# Patient Record
Sex: Female | Born: 1985 | Race: Black or African American | Hispanic: No | Marital: Married | Smoking: Never smoker
Health system: Southern US, Community
[De-identification: ages and names within clinical notes are randomized; demographics above are authoritative.]

## PROBLEM LIST (undated history)

## (undated) DIAGNOSIS — N6320 Unspecified lump in the left breast, unspecified quadrant: Secondary | ICD-10-CM

## (undated) DIAGNOSIS — D6859 Other primary thrombophilia: Secondary | ICD-10-CM

## (undated) HISTORY — PX: BREAST LUMPECTOMY: SHX2

---

## 2006-11-22 ENCOUNTER — Encounter: Admission: RE | Admit: 2006-11-22 | Discharge: 2006-11-22 | Payer: Self-pay | Admitting: Obstetrics and Gynecology

## 2007-04-24 ENCOUNTER — Ambulatory Visit: Payer: Self-pay | Admitting: Hematology and Oncology

## 2007-05-06 LAB — BASIC METABOLIC PANEL
BUN: 9 mg/dL (ref 6–23)
CO2: 23 mEq/L (ref 19–32)
Calcium: 9.2 mg/dL (ref 8.4–10.5)
Chloride: 103 mEq/L (ref 96–112)
Creatinine, Ser: 0.59 mg/dL (ref 0.40–1.20)
Glucose, Bld: 89 mg/dL (ref 70–99)
Potassium: 4 mEq/L (ref 3.5–5.3)
Sodium: 136 mEq/L (ref 135–145)

## 2007-05-06 LAB — CBC WITH DIFFERENTIAL/PLATELET
BASO%: 0.6 % (ref 0.0–2.0)
Basophils Absolute: 0.1 10*3/uL (ref 0.0–0.1)
EOS%: 2.7 % (ref 0.0–7.0)
Eosinophils Absolute: 0.2 10*3/uL (ref 0.0–0.5)
HCT: 34.9 % (ref 34.8–46.6)
HGB: 12 g/dL (ref 11.6–15.9)
LYMPH%: 11.3 % — ABNORMAL LOW (ref 14.0–48.0)
MCH: 29 pg (ref 26.0–34.0)
MCHC: 34.3 g/dL (ref 32.0–36.0)
MCV: 84.5 fL (ref 81.0–101.0)
MONO#: 0.5 10*3/uL (ref 0.1–0.9)
MONO%: 5.6 % (ref 0.0–13.0)
NEUT#: 7 10*3/uL — ABNORMAL HIGH (ref 1.5–6.5)
NEUT%: 79.8 % — ABNORMAL HIGH (ref 39.6–76.8)
Platelets: 253 10*3/uL (ref 145–400)
RBC: 4.14 10*6/uL (ref 3.70–5.32)
RDW: 14.6 % — ABNORMAL HIGH (ref 11.3–14.5)
WBC: 8.8 10*3/uL (ref 3.9–10.0)
lymph#: 1 10*3/uL (ref 0.9–3.3)

## 2007-05-16 LAB — CBC WITH DIFFERENTIAL/PLATELET
BASO%: 0.3 % (ref 0.0–2.0)
Basophils Absolute: 0 10*3/uL (ref 0.0–0.1)
EOS%: 2.9 % (ref 0.0–7.0)
Eosinophils Absolute: 0.3 10*3/uL (ref 0.0–0.5)
HCT: 34.5 % — ABNORMAL LOW (ref 34.8–46.6)
HGB: 11.7 g/dL (ref 11.6–15.9)
LYMPH%: 13.3 % — ABNORMAL LOW (ref 14.0–48.0)
MCH: 28.6 pg (ref 26.0–34.0)
MCHC: 34 g/dL (ref 32.0–36.0)
MCV: 84.1 fL (ref 81.0–101.0)
MONO#: 0.6 10*3/uL (ref 0.1–0.9)
MONO%: 5.4 % (ref 0.0–13.0)
NEUT#: 8.2 10*3/uL — ABNORMAL HIGH (ref 1.5–6.5)
NEUT%: 78.1 % — ABNORMAL HIGH (ref 39.6–76.8)
Platelets: 242 10*3/uL (ref 145–400)
RBC: 4.09 10*6/uL (ref 3.70–5.32)
RDW: 14.7 % — ABNORMAL HIGH (ref 11.3–14.5)
WBC: 10.5 10*3/uL — ABNORMAL HIGH (ref 3.9–10.0)
lymph#: 1.4 10*3/uL (ref 0.9–3.3)

## 2007-08-12 ENCOUNTER — Ambulatory Visit: Payer: Self-pay | Admitting: Hematology and Oncology

## 2007-08-14 LAB — CBC WITH DIFFERENTIAL/PLATELET
BASO%: 0.1 % (ref 0.0–2.0)
Basophils Absolute: 0 10*3/uL (ref 0.0–0.1)
EOS%: 0.5 % (ref 0.0–7.0)
Eosinophils Absolute: 0.1 10*3/uL (ref 0.0–0.5)
HCT: 30 % — ABNORMAL LOW (ref 34.8–46.6)
HGB: 10.3 g/dL — ABNORMAL LOW (ref 11.6–15.9)
LYMPH%: 11.4 % — ABNORMAL LOW (ref 14.0–48.0)
MCH: 28.1 pg (ref 26.0–34.0)
MCHC: 34.3 g/dL (ref 32.0–36.0)
MCV: 81.9 fL (ref 81.0–101.0)
MONO#: 0.7 10*3/uL (ref 0.1–0.9)
MONO%: 4.8 % (ref 0.0–13.0)
NEUT#: 11.6 10*3/uL — ABNORMAL HIGH (ref 1.5–6.5)
NEUT%: 83.2 % — ABNORMAL HIGH (ref 39.6–76.8)
Platelets: 214 10*3/uL (ref 145–400)
RBC: 3.66 10*6/uL — ABNORMAL LOW (ref 3.70–5.32)
RDW: 13.9 % (ref 11.3–14.5)
WBC: 14 10*3/uL — ABNORMAL HIGH (ref 3.9–10.0)
lymph#: 1.6 10*3/uL (ref 0.9–3.3)

## 2007-10-10 ENCOUNTER — Ambulatory Visit: Payer: Self-pay | Admitting: Hematology and Oncology

## 2007-10-23 LAB — CBC WITH DIFFERENTIAL/PLATELET
BASO%: 0.3 % (ref 0.0–2.0)
Basophils Absolute: 0 10*3/uL (ref 0.0–0.1)
EOS%: 0.3 % (ref 0.0–7.0)
Eosinophils Absolute: 0 10*3/uL (ref 0.0–0.5)
HCT: 31.1 % — ABNORMAL LOW (ref 34.8–46.6)
HGB: 10.2 g/dL — ABNORMAL LOW (ref 11.6–15.9)
LYMPH%: 13.4 % — ABNORMAL LOW (ref 14.0–48.0)
MCH: 25.1 pg — ABNORMAL LOW (ref 26.0–34.0)
MCHC: 32.7 g/dL (ref 32.0–36.0)
MCV: 76.9 fL — ABNORMAL LOW (ref 81.0–101.0)
MONO#: 0.7 10*3/uL (ref 0.1–0.9)
MONO%: 5.6 % (ref 0.0–13.0)
NEUT#: 9.8 10*3/uL — ABNORMAL HIGH (ref 1.5–6.5)
NEUT%: 80.4 % — ABNORMAL HIGH (ref 39.6–76.8)
Platelets: 233 10*3/uL (ref 145–400)
RBC: 4.05 10*6/uL (ref 3.70–5.32)
RDW: 14.2 % (ref 11.3–14.5)
WBC: 12.2 10*3/uL — ABNORMAL HIGH (ref 3.9–10.0)
lymph#: 1.6 10*3/uL (ref 0.9–3.3)

## 2007-11-08 ENCOUNTER — Inpatient Hospital Stay (HOSPITAL_COMMUNITY): Admission: RE | Admit: 2007-11-08 | Discharge: 2007-11-11 | Payer: Self-pay | Admitting: Obstetrics and Gynecology

## 2009-03-09 ENCOUNTER — Encounter (INDEPENDENT_AMBULATORY_CARE_PROVIDER_SITE_OTHER): Payer: Self-pay | Admitting: Obstetrics and Gynecology

## 2009-03-09 ENCOUNTER — Ambulatory Visit (HOSPITAL_COMMUNITY): Admission: RE | Admit: 2009-03-09 | Discharge: 2009-03-09 | Payer: Self-pay | Admitting: Obstetrics and Gynecology

## 2009-03-09 HISTORY — PX: DILATION AND EVACUATION: SHX1459

## 2009-12-21 ENCOUNTER — Encounter: Admission: RE | Admit: 2009-12-21 | Discharge: 2009-12-21 | Payer: Self-pay | Admitting: Gastroenterology

## 2010-06-26 ENCOUNTER — Encounter: Payer: Self-pay | Admitting: Obstetrics and Gynecology

## 2010-08-23 ENCOUNTER — Other Ambulatory Visit: Payer: Self-pay | Admitting: Hematology and Oncology

## 2010-08-23 ENCOUNTER — Encounter (HOSPITAL_BASED_OUTPATIENT_CLINIC_OR_DEPARTMENT_OTHER): Payer: 59 | Admitting: Hematology and Oncology

## 2010-08-23 DIAGNOSIS — O99891 Other specified diseases and conditions complicating pregnancy: Secondary | ICD-10-CM

## 2010-08-23 DIAGNOSIS — D6859 Other primary thrombophilia: Secondary | ICD-10-CM

## 2010-08-23 LAB — CBC WITH DIFFERENTIAL/PLATELET
BASO%: 0.3 % (ref 0.0–2.0)
Basophils Absolute: 0 10*3/uL (ref 0.0–0.1)
EOS%: 0.3 % (ref 0.0–7.0)
Eosinophils Absolute: 0 10*3/uL (ref 0.0–0.5)
HCT: 35.7 % (ref 34.8–46.6)
HGB: 12 g/dL (ref 11.6–15.9)
LYMPH%: 15.4 % (ref 14.0–49.7)
MCH: 28.5 pg (ref 25.1–34.0)
MCHC: 33.6 g/dL (ref 31.5–36.0)
MCV: 84.6 fL (ref 79.5–101.0)
MONO#: 0.5 10*3/uL (ref 0.1–0.9)
MONO%: 4.5 % (ref 0.0–14.0)
NEUT#: 8.9 10*3/uL — ABNORMAL HIGH (ref 1.5–6.5)
NEUT%: 79.5 % — ABNORMAL HIGH (ref 38.4–76.8)
Platelets: 237 10*3/uL (ref 145–400)
RBC: 4.21 10*6/uL (ref 3.70–5.45)
RDW: 13.8 % (ref 11.2–14.5)
WBC: 11.2 10*3/uL — ABNORMAL HIGH (ref 3.9–10.3)
lymph#: 1.7 10*3/uL (ref 0.9–3.3)

## 2010-08-23 LAB — COMPREHENSIVE METABOLIC PANEL
ALT: <8 U/L (ref 0–35)
AST: 12 U/L (ref 0–37)
Albumin: 4.2 g/dL (ref 3.5–5.2)
Alkaline Phosphatase: 36 U/L — ABNORMAL LOW (ref 39–117)
BUN: 11 mg/dL (ref 6–23)
CO2: 23 mEq/L (ref 19–32)
Calcium: 9.1 mg/dL (ref 8.4–10.5)
Chloride: 103 mEq/L (ref 96–112)
Creatinine, Ser: 0.61 mg/dL (ref 0.40–1.20)
Glucose, Bld: 76 mg/dL (ref 70–99)
Potassium: 4 mEq/L (ref 3.5–5.3)
Sodium: 137 mEq/L (ref 135–145)
Total Bilirubin: 0.4 mg/dL (ref 0.3–1.2)
Total Protein: 6.8 g/dL (ref 6.0–8.3)

## 2010-08-23 LAB — LACTATE DEHYDROGENASE: LDH: 85 U/L — ABNORMAL LOW (ref 94–250)

## 2010-08-29 LAB — HEPATITIS B SURFACE ANTIGEN: Hepatitis B Surface Ag: NEGATIVE

## 2010-08-29 LAB — HIV ANTIBODY (ROUTINE TESTING W REFLEX): HIV: NONREACTIVE

## 2010-08-29 LAB — RUBELLA ANTIBODY, IGM: Rubella: UNDETERMINED

## 2010-08-29 LAB — ABO/RH: RH Type: NEGATIVE

## 2010-08-29 LAB — RPR: RPR: NONREACTIVE

## 2010-09-07 LAB — CBC
HCT: 39.4 % (ref 36.0–46.0)
Hemoglobin: 12.9 g/dL (ref 12.0–15.0)
MCHC: 32.7 g/dL (ref 30.0–36.0)
MCV: 86.8 fL (ref 78.0–100.0)
Platelets: 242 10*3/uL (ref 150–400)
RBC: 4.54 MIL/uL (ref 3.87–5.11)
RDW: 15.1 % (ref 11.5–15.5)
WBC: 11.6 10*3/uL — ABNORMAL HIGH (ref 4.0–10.5)

## 2010-09-07 LAB — RH IMMUNE GLOBULIN WORKUP (NOT WOMEN'S HOSP)
ABO/RH(D): A NEG
Antibody Screen: NEGATIVE

## 2010-09-27 ENCOUNTER — Other Ambulatory Visit: Payer: Self-pay | Admitting: Hematology and Oncology

## 2010-09-27 ENCOUNTER — Encounter (HOSPITAL_BASED_OUTPATIENT_CLINIC_OR_DEPARTMENT_OTHER): Payer: 59 | Admitting: Hematology and Oncology

## 2010-09-27 DIAGNOSIS — O99891 Other specified diseases and conditions complicating pregnancy: Secondary | ICD-10-CM

## 2010-09-27 DIAGNOSIS — D6859 Other primary thrombophilia: Secondary | ICD-10-CM

## 2010-09-27 DIAGNOSIS — Z7901 Long term (current) use of anticoagulants: Secondary | ICD-10-CM

## 2010-09-27 LAB — CBC WITH DIFFERENTIAL/PLATELET
BASO%: 0.3 % (ref 0.0–2.0)
Basophils Absolute: 0 10*3/uL (ref 0.0–0.1)
EOS%: 0.5 % (ref 0.0–7.0)
Eosinophils Absolute: 0.1 10*3/uL (ref 0.0–0.5)
HCT: 35.6 % (ref 34.8–46.6)
HGB: 12 g/dL (ref 11.6–15.9)
LYMPH%: 13.5 % — ABNORMAL LOW (ref 14.0–49.7)
MCH: 28.9 pg (ref 25.1–34.0)
MCHC: 33.7 g/dL (ref 31.5–36.0)
MCV: 85.9 fL (ref 79.5–101.0)
MONO#: 0.5 10*3/uL (ref 0.1–0.9)
MONO%: 4.5 % (ref 0.0–14.0)
NEUT#: 8.8 10*3/uL — ABNORMAL HIGH (ref 1.5–6.5)
NEUT%: 81.2 % — ABNORMAL HIGH (ref 38.4–76.8)
Platelets: 207 10*3/uL (ref 145–400)
RBC: 4.15 10*6/uL (ref 3.70–5.45)
RDW: 15.1 % — ABNORMAL HIGH (ref 11.2–14.5)
WBC: 10.9 10*3/uL — ABNORMAL HIGH (ref 3.9–10.3)
lymph#: 1.5 10*3/uL (ref 0.9–3.3)

## 2010-10-17 NOTE — Op Note (Signed)
Lynn Cox, SPILLMAN              ACCOUNT NO.:  [id number]   MEDICAL RECORD NO.:  [id number]          PATIENT TYPE:  INP   LOCATION:  9117                          FACILITY:  [facility]   PHYSICIAN:  Maxie Better, M.D.DATE OF BIRTH:  November 30, 1985   DATE OF PROCEDURE:  11/08/2007  DATE OF DISCHARGE:                               OPERATIVE REPORT   PREOPERATIVE DIAGNOSES:  1. Previous cesarean section.  2. Protein S deficiency.  3. Term gestation.   PROCEDURES:  1. Repeat cesarean section.  2. Sharl Ma hysterotomy.   POSTOPERATIVE DIAGNOSES:  1. Protein S deficiency.  2. Previous cesarean section.  3. Term gestation   ANESTHESIA:  Spinal.   SURGEON:  Maxie Better, MD   ASSISTANT:  None.   INDICATION:  This is a 22-year gravida 3, para 1-0-1-1 married black  female at term with diagnosis of protein S deficiency previously on  Lovenox until 24 hours prior to delivery with a prior history of a  cesarean section who now is being admitted for repeat cesarean section.  Surgical risk was reviewed with the patient.  Consent was signed and the  patient was transferred to the operating room.   PROCEDURE:  Under adequate spinal anesthesia, the patient was placed in  supine position with a left lateral tilt.  She was sterilely prepped and  draped in the usual fashion.  An indwelling Foley catheter was sterilely  placed.  Marcaine 0.25% was injected along the previous Pfannenstiel  skin incision.  Pfannenstiel skin incision was then made carried down  through the rectus fascia.  The rectus fascia was noted to be in phase  with the rectus abdominal muscle due to diastasis.  The rectus fascia  was carefully opened transversely.  The rectus muscle was already split  in midline.  The parietal peritoneum was incidentally opened creating  the opening of rectus fascia.  That incision was then extended.  Upon  entering the abdominal cavity, the abdomen was inspected.  Lower uterine  segment  was tapered thin.  The bladder was then carefully dissected off  the lower uterine segment, displaced inferiorly.  Curvilinear low  transverse incision was then made and extended with bandage scissors.  Artificial rupture of membranes, clear amniotic fluid was noted.  Copious amount.  Subsequent delivery of a live female with bulb suction in  the abdomen.  Cord was clamped and cut.  The baby was transferred to the  awaiting pediatricians, who has found Apgars of 8 and 9 at 1 and 5  minutes.  Placenta was spontaneously intact, not sent.  Uterine cavity  was cleaned of debris.  Uterine incision was noted to have no extension  and it was closed in one layer due to the thinness and 0 Monocryl  running lock stitch was utilized.  Good hemostasis was noted.  Normal  tubes and ovaries were noted bilaterally.  The abdomen was then  copiously irrigated and suctioned of debris.  Parietal peritoneum was  closed with 2-0 Vicryl.  The rectus fascia was then closed with 0 Vicryl  x2.  The subcutaneous area was irrigated.  Small bleeders  cauterized.  Interrupted 2-0 plain sutures was placed.  Skin was reapproximated using  Ethicon staples.  Specimen was placenta not sent to pathology.  Estimated blood loss was 400 mL.  Intraoperative fluid was 2.8 L.  Urine  output was 200 mL clear yellow urine.  Sponge and instrument counts x2  was correct.  Weight of the baby was 8 pounds 11 ounces.  The patient  tolerated the procedure well and was transferred to recovery in stable  condition.      Maxie Better, M.D.  Electronically Signed     [place]/MEDQ  D:  11/08/2007  T:  11/09/2007  Job:  [id number]

## 2010-10-20 NOTE — Discharge Summary (Signed)
[name], Cox              ACCOUNT NO.:  [id number]   MEDICAL RECORD NO.:  [id number]          PATIENT TYPE:  INP   LOCATION:  9117                          [facility]  [facility]   PHYSICIAN:  Maxie Better, M.D.DATE OF BIRTH:  Jun 27, 1985   DATE OF ADMISSION:  11/08/2007  DATE OF DISCHARGE:  11/11/2007                               DISCHARGE SUMMARY   ADMISSION DIAGNOSES:  1. Previous cesarean section.  2. Term gestation.  3. Protein S deficiency.   DISCHARGE DIAGNOSES:  1. Protein S deficiency.  2. Term gestation delivered.  3. Previous cesarean section.   PROCEDURE:  Repeat cesarean section.   HISTORY OF PRESENT ILLNESS:  A 25 year old gravida 3, para 1-0-1-1  female with a previous cesarean section and protein S deficiency who is  now being admitted for a scheduled repeat cesarean section.  The patient  was found to be protein S deficiency after workup was done for a  previous pregnancy loss.  The patient had been on Lovenox until 24 hours  prior to her surgery.  Prenatal course was otherwise unremarkable.  She  is Rh negative and rubella immune.   HOSPITAL COURSE:  The patient was admitted to [facility].  She  underwent a repeat cesarean section.  Procedure resulted in delivery of  an 8 pound 15-ounce live female, Apgars are 8 and 9.  Normal tubes and  ovaries were noted at that time.  The patient had an uncomplicated  postoperative course where Lovenox was restarted 24 hours after her  delivery.  Her CBC on postop day #1 showed a hemoglobin of 9.8,  hematocrit 29.8, white count of 13.7, and platelet count 199,000.  By  postop day #3, the patient was doing well.  She had flatus.  She was  tolerating a regular diet.  Her incision showed no evidence of an  infection.  She was deemed well to go home.  The baby was Rh negative,  and therefore RhoGAM was not given.   DISPOSITION:  Home.   CONDITION:  Stable.   DISCHARGE MEDICATIONS:  1. Lovenox 40 mg subcu  daily.  2. Percocet 1-2 tablets every 4-6 hours p.r.n. pain.  3. Prenatal vitamins 1 p.o. daily.   DISCHARGE INSTRUCTIONS:  Postpartum booklet given.  Followup appointment  at [facility] in 6 weeks and follow up with Hematology/Oncology as  previously scheduled.      Maxie Better, M.D.  Electronically Signed     [place]/MEDQ  D:  12/25/2007  T:  12/25/2007  Job:  [id number]

## 2010-11-29 ENCOUNTER — Other Ambulatory Visit: Payer: Self-pay | Admitting: Hematology and Oncology

## 2010-11-29 ENCOUNTER — Encounter (HOSPITAL_BASED_OUTPATIENT_CLINIC_OR_DEPARTMENT_OTHER): Payer: 59 | Admitting: Hematology and Oncology

## 2010-11-29 DIAGNOSIS — Z7901 Long term (current) use of anticoagulants: Secondary | ICD-10-CM

## 2010-11-29 DIAGNOSIS — O99891 Other specified diseases and conditions complicating pregnancy: Secondary | ICD-10-CM

## 2010-11-29 DIAGNOSIS — D6859 Other primary thrombophilia: Secondary | ICD-10-CM

## 2010-11-29 LAB — CBC WITH DIFFERENTIAL/PLATELET
BASO%: 0.2 % (ref 0.0–2.0)
Basophils Absolute: 0 10*3/uL (ref 0.0–0.1)
EOS%: 0.7 % (ref 0.0–7.0)
Eosinophils Absolute: 0.1 10*3/uL (ref 0.0–0.5)
HCT: 33.5 % — ABNORMAL LOW (ref 34.8–46.6)
HGB: 11.3 g/dL — ABNORMAL LOW (ref 11.6–15.9)
LYMPH%: 13.6 % — ABNORMAL LOW (ref 14.0–49.7)
MCH: 29.4 pg (ref 25.1–34.0)
MCHC: 33.7 g/dL (ref 31.5–36.0)
MCV: 87.2 fL (ref 79.5–101.0)
MONO#: 0.8 10*3/uL (ref 0.1–0.9)
MONO%: 6 % (ref 0.0–14.0)
NEUT#: 10.4 10*3/uL — ABNORMAL HIGH (ref 1.5–6.5)
NEUT%: 79.5 % — ABNORMAL HIGH (ref 38.4–76.8)
Platelets: 206 10*3/uL (ref 145–400)
RBC: 3.84 10*6/uL (ref 3.70–5.45)
RDW: 14.2 % (ref 11.2–14.5)
WBC: 13 10*3/uL — ABNORMAL HIGH (ref 3.9–10.3)
lymph#: 1.8 10*3/uL (ref 0.9–3.3)

## 2010-12-21 ENCOUNTER — Ambulatory Visit (HOSPITAL_COMMUNITY)
Admission: RE | Admit: 2010-12-21 | Discharge: 2010-12-21 | Disposition: A | Discharge status: Home / Self Care | Payer: 59 | Source: Ambulatory Visit | Attending: Obstetrics & Gynecology | Admitting: Obstetrics & Gynecology

## 2010-12-21 DIAGNOSIS — M79609 Pain in unspecified limb: Secondary | ICD-10-CM | POA: Insufficient documentation

## 2010-12-21 DIAGNOSIS — M7989 Other specified soft tissue disorders: Secondary | ICD-10-CM | POA: Insufficient documentation

## 2011-01-10 LAB — ANTIBODY SCREEN: Antibody Screen: NEGATIVE

## 2011-03-01 LAB — CBC
HCT: 29.8 — ABNORMAL LOW
HCT: 31.1 — ABNORMAL LOW
Hemoglobin: 10.4 — ABNORMAL LOW
Hemoglobin: 9.8 — ABNORMAL LOW
MCHC: 33
MCHC: 33.4
MCV: 76.9 — ABNORMAL LOW
MCV: 77.7 — ABNORMAL LOW
Platelets: 199
Platelets: 228
RBC: 3.87
RBC: 4
RDW: 15.2
RDW: 15.2
WBC: 12.7 — ABNORMAL HIGH
WBC: 13.7 — ABNORMAL HIGH

## 2011-03-01 LAB — RPR: RPR Ser Ql: NONREACTIVE

## 2011-03-08 ENCOUNTER — Other Ambulatory Visit: Payer: Self-pay | Admitting: Hematology and Oncology

## 2011-03-08 ENCOUNTER — Ambulatory Visit (HOSPITAL_BASED_OUTPATIENT_CLINIC_OR_DEPARTMENT_OTHER): Payer: 59 | Admitting: Hematology and Oncology

## 2011-03-08 DIAGNOSIS — D689 Coagulation defect, unspecified: Secondary | ICD-10-CM

## 2011-03-08 DIAGNOSIS — D6859 Other primary thrombophilia: Secondary | ICD-10-CM

## 2011-03-08 DIAGNOSIS — Z7901 Long term (current) use of anticoagulants: Secondary | ICD-10-CM

## 2011-03-08 DIAGNOSIS — O99891 Other specified diseases and conditions complicating pregnancy: Secondary | ICD-10-CM

## 2011-03-08 LAB — CBC WITH DIFFERENTIAL/PLATELET
BASO%: 0.2 % (ref 0.0–2.0)
Basophils Absolute: 0 10*3/uL (ref 0.0–0.1)
EOS%: 0.6 % (ref 0.0–7.0)
Eosinophils Absolute: 0.1 10*3/uL (ref 0.0–0.5)
HCT: 32.1 % — ABNORMAL LOW (ref 34.8–46.6)
HGB: 10.9 g/dL — ABNORMAL LOW (ref 11.6–15.9)
LYMPH%: 11.6 % — ABNORMAL LOW (ref 14.0–49.7)
MCH: 28.8 pg (ref 25.1–34.0)
MCHC: 34.1 g/dL (ref 31.5–36.0)
MCV: 84.5 fL (ref 79.5–101.0)
MONO#: 0.8 10*3/uL (ref 0.1–0.9)
MONO%: 6.1 % (ref 0.0–14.0)
NEUT#: 10 10*3/uL — ABNORMAL HIGH (ref 1.5–6.5)
NEUT%: 81.5 % — ABNORMAL HIGH (ref 38.4–76.8)
Platelets: 165 10*3/uL (ref 145–400)
RBC: 3.8 10*6/uL (ref 3.70–5.45)
RDW: 14.3 % (ref 11.2–14.5)
WBC: 12.3 10*3/uL — ABNORMAL HIGH (ref 3.9–10.3)
lymph#: 1.4 10*3/uL (ref 0.9–3.3)

## 2011-03-08 NOTE — Progress Notes (Signed)
A user error has taken place: encounter opened in error, closed for administrative reasons.

## 2011-03-08 NOTE — Progress Notes (Deleted)
Office progress notes on patient name given to poorly medical record number is [id number] date of birth is 01/11/1986. Today's date is 03/08/2011.  CC; Maxie Better M.D.  Denies identifying statement The patient is a 25 year old woman with a history of protein C deficiency and miscarriages presents for followup.  Interim history  Mrs. Nordmann pregnancy is progressing quite well. Her scheduled C-section is due on April 02 2011. She's tolerating Lanoxin very minimal difficulties. She denies excessive bruising.  CBC dated October the fourth 2012 notes your a white cell count of 12.3 hemoglobin 10.9 hematocrit 32.1 platelets 165. Mr. Cathlyn Parsons is a pleasant 25 year old man who comes in today for followup on his bilateral knee pain. The left knee at its worse than the right knee. He was seen previously on 12/19/2010 on he was treated with bilateral cortisone injections in both knees, which gave him refill 2-1/2 months. He states that his knee pain: Was about 2 weeks ago. The left knee is worse than the right knee. The pain is excruciating and severe, 10/10. He has a splint in the last 2 nights to the knee pain. The pain is sharp, burning. Radiating distally and sometimes with rest. At the last Examination. Allergies sulfa Family history significant for coronary heart disease. Social history does not smoke. He is retired. Diagnoses  metastatic colorectal carcinoma.  Narrative space the Mrs. Michael Boston is seen today for weekly 20 treatments were used. She received her first radiation therapy to your today directed at the right chest area; 2-5 cGy a planned 5625 cGy]. The patient is tolerating her treatments well as far. She in addition to start radiosensitizing oral Xeloda chemotherapy today. Joints with his twice daily during her radiation treatments. On examination the patient supplemental oxygen in place at 2 L. The patient's weight is 1-4.3 pounds. Pulse is 78. Examination of lungs reveals decreased  breath sounds in the right lower lung field. Left lung is clear. The heart has a regular rhythm and rate.    Impression and plan A. the patient is tolerating her treatments well thus far. The patient's irradiation fields are sitting up adequately. The patient's radiation chart has checked today. Plan is to continue to curative dose of 5625 cGy along with radiosensitizing chemotherapy.    Lab data 03/07/2011 sodium 136 potassium 4.4 chloride 96 CO2 31 BUN 20 creatinine 0.80 glucose 20 calcium 10.1 and the B12 727. Basic metabolic panel Total bilirubin 0.7 alkaline phosphatase 96 AST 18 ALT 10 total protein FOLFOX 6 .7 albumin 4.4, soon 10 CEA 0.8 a CT scan of the chest performed at [facility] pulmonary emboli the posterior medial segment of the bilateral lower lobes. Bronchiectatic changes and parenchymal opacities were seen within the dependent portion of the lung felt to reflect only fibrosis. A 5 mm nodular groundglass opacities were seen in the left lung apex concerning for a primary malignancy. Comparison with studies were necessary to ensure stability. Ossifications were noted in the coronary vasculature. The heart and great vessels were unremarkable. Since was admitted and received IV heparin dosed per pharmacy protocol. The patient is a 25 year old woman with a long history of Crohn's disease with symptomatic as an adolescent and then diagnosed in the early 2s. She was initially felt to have Crohn's colitis however this diagnosis was in question of a possible diagnosis of ulcerative colitis. She underwent a total proctocolectomy with creation of a J-pouch on Gavin Pound to be ostomy. This was then complicated with multiple optimal disease and off the initial takedown  of a diverting ileostomy required replacement of admission. She required with a removal in 2002.  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 He is received  multiple chemotherapeutic agents in the past including FOLFOX 6, FOLFERI platelet inhibitors every 8 hours F. inhibitors VEGF inhibitors and platelet inhibitors inhibitors VEGF inhibitors oxaliplatin Irinotecan, 5-FU chemotherapy, st hand-foot syndrome, mucositis, nausea vomiting and diarrhea, fatigue, ECOG performance status.  The patient is status post colectomy on 12/12/2010. He then went on to receive 6 cycles of FOLFOX 6. Surgical pathology revealed as the cells are mucinous cells pancreatic fluid and ascites Micajah Dennin Owin V[place] electrolytes and liver metastases have Abdulla Pooley to fats to the limit to Petrita Blunck Vicente Serene Quenten Nawaz [facility], [facility]

## 2011-03-22 ENCOUNTER — Other Ambulatory Visit: Payer: Self-pay | Admitting: Obstetrics and Gynecology

## 2011-03-28 ENCOUNTER — Encounter (HOSPITAL_COMMUNITY): Payer: Self-pay

## 2011-03-29 ENCOUNTER — Encounter (HOSPITAL_COMMUNITY)
Admission: RE | Admit: 2011-03-29 | Discharge: 2011-03-29 | Disposition: A | Discharge status: Home / Self Care | Payer: 59 | Source: Ambulatory Visit | Attending: Obstetrics and Gynecology | Admitting: Obstetrics and Gynecology

## 2011-03-29 ENCOUNTER — Encounter (HOSPITAL_COMMUNITY): Payer: Self-pay

## 2011-03-29 LAB — CBC
HCT: 33.8 % — ABNORMAL LOW (ref 36.0–46.0)
Hemoglobin: 11 g/dL — ABNORMAL LOW (ref 12.0–15.0)
MCH: 27.2 pg (ref 26.0–34.0)
MCHC: 32.5 g/dL (ref 30.0–36.0)
MCV: 83.5 fL (ref 78.0–100.0)
Platelets: 179 10*3/uL (ref 150–400)
RBC: 4.05 MIL/uL (ref 3.87–5.11)
RDW: 14.9 % (ref 11.5–15.5)
WBC: 12.8 10*3/uL — ABNORMAL HIGH (ref 4.0–10.5)

## 2011-03-29 LAB — PROTIME-INR
INR: 0.92 (ref 0.00–1.49)
Prothrombin Time: 12.6 seconds (ref 11.6–15.2)

## 2011-03-29 LAB — SURGICAL PCR SCREEN
MRSA, PCR: NEGATIVE
Staphylococcus aureus: NEGATIVE

## 2011-03-29 LAB — APTT: aPTT: 26 seconds (ref 24–37)

## 2011-03-29 LAB — RPR: RPR Ser Ql: NONREACTIVE

## 2011-03-29 NOTE — Patient Instructions (Signed)
   Your procedure is scheduled on:Mon 10/29  Enter through the Main Entrance of [facility] at:0900 Pick up the phone at the desk and dial [phone] and inform us of your arrival.  Please call this number if you have any problems the morning of surgery: [phone]  Remember: Do not eat food after midnight:Sunday  Do not drink clear liquids after:0630 Monday Take these medicines the morning of surgery with a SIP OF WATER:none  Do not wear jewelry, make-up, or FINGER nail polish Do not wear lotions, powders, or perfumes.  You may wear deodorant. Do not shave 48 hours prior to surgery. Do not bring valuables to the hospital.  Leave suitcase in the car. After Surgery it may be brought to your room. For patients being admitted to the hospital, checkout time is 11:00am the day of discharge.  Patients discharged on the day of surgery will not be allowed to drive home.   Name and phone number of your driver: Nalee Lightle- [phone]  Remember to use your hibiclens as instructed.Please shower with 1/2 bottle the evening before your surgery and the other 1/2 bottle the morning of surgery.

## 2011-04-01 MED ORDER — CEFAZOLIN SODIUM-DEXTROSE 2-3 GM-% IV SOLR
2.0000 g | INTRAVENOUS | Status: AC
  Administered 2011-04-02: 2 g via INTRAVENOUS
  Filled 2011-04-01: qty 50

## 2011-04-02 ENCOUNTER — Encounter (HOSPITAL_COMMUNITY)
Admission: RE | Disposition: A | Discharge status: Home / Self Care | Payer: Self-pay | Source: Ambulatory Visit | Attending: Obstetrics and Gynecology

## 2011-04-02 ENCOUNTER — Other Ambulatory Visit: Payer: Self-pay | Admitting: Obstetrics and Gynecology

## 2011-04-02 ENCOUNTER — Encounter (HOSPITAL_COMMUNITY): Payer: Self-pay | Admitting: Anesthesiology

## 2011-04-02 ENCOUNTER — Inpatient Hospital Stay (HOSPITAL_COMMUNITY)
Admission: RE | Admit: 2011-04-02 | Discharge: 2011-04-05 | DRG: 765 | Disposition: A | Discharge status: Home / Self Care | Payer: 59 | Source: Ambulatory Visit | Attending: Obstetrics and Gynecology | Admitting: Obstetrics and Gynecology

## 2011-04-02 ENCOUNTER — Inpatient Hospital Stay (HOSPITAL_COMMUNITY): Payer: 59 | Admitting: Anesthesiology

## 2011-04-02 ENCOUNTER — Encounter (HOSPITAL_COMMUNITY): Payer: Self-pay | Admitting: Emergency Medicine

## 2011-04-02 ENCOUNTER — Encounter (HOSPITAL_COMMUNITY): Payer: Self-pay | Admitting: *Deleted

## 2011-04-02 DIAGNOSIS — D689 Coagulation defect, unspecified: Secondary | ICD-10-CM | POA: Diagnosis present

## 2011-04-02 DIAGNOSIS — Z302 Encounter for sterilization: Secondary | ICD-10-CM

## 2011-04-02 DIAGNOSIS — D6859 Other primary thrombophilia: Secondary | ICD-10-CM | POA: Diagnosis present

## 2011-04-02 DIAGNOSIS — Z01812 Encounter for preprocedural laboratory examination: Secondary | ICD-10-CM

## 2011-04-02 DIAGNOSIS — Z01818 Encounter for other preprocedural examination: Secondary | ICD-10-CM

## 2011-04-02 DIAGNOSIS — O34219 Maternal care for unspecified type scar from previous cesarean delivery: Principal | ICD-10-CM | POA: Diagnosis present

## 2011-04-02 LAB — TYPE AND SCREEN
ABO/RH(D): A NEG
Antibody Screen: NEGATIVE

## 2011-04-02 SURGERY — Surgical Case
Anesthesia: Spinal | Wound class: Clean Contaminated

## 2011-04-02 MED ORDER — IBUPROFEN 600 MG PO TABS: 600.0000 mg | ORAL_TABLET | Freq: Four times a day (QID) | ORAL | Status: DC | PRN

## 2011-04-02 MED ORDER — OXYTOCIN 20 UNITS IN LACTATED RINGERS INFUSION - SIMPLE
INTRAVENOUS | Status: DC | PRN
  Administered 2011-04-02: 20 [IU] via INTRAVENOUS

## 2011-04-02 MED ORDER — DIPHENHYDRAMINE HCL 50 MG/ML IJ SOLN: 12.5000 mg | INTRAMUSCULAR | Status: DC | PRN

## 2011-04-02 MED ORDER — CEFAZOLIN SODIUM 1-5 GM-% IV SOLN
1.0000 g | Freq: Three times a day (TID) | INTRAVENOUS | Status: AC
  Administered 2011-04-02 (×2): 1 g via INTRAVENOUS
  Filled 2011-04-02 (×2): qty 50

## 2011-04-02 MED ORDER — LACTATED RINGERS IV SOLN
INTRAVENOUS | Status: DC
  Administered 2011-04-02 (×3): via INTRAVENOUS

## 2011-04-02 MED ORDER — SODIUM CHLORIDE 0.9 % IV SOLN: 1.0000 ug/kg/h | INTRAVENOUS | Status: DC | PRN

## 2011-04-02 MED ORDER — KETOROLAC TROMETHAMINE 60 MG/2ML IM SOLN
INTRAMUSCULAR | Status: AC
  Administered 2011-04-02: 60 mg via INTRAMUSCULAR
  Filled 2011-04-02: qty 2

## 2011-04-02 MED ORDER — OXYTOCIN 20 UNITS IN LACTATED RINGERS INFUSION - SIMPLE
INTRAVENOUS | Status: AC
  Filled 2011-04-02: qty 1000

## 2011-04-02 MED ORDER — LANOLIN HYDROUS EX OINT: 1.0000 "application " | TOPICAL_OINTMENT | CUTANEOUS | Status: DC | PRN

## 2011-04-02 MED ORDER — SIMETHICONE 80 MG PO CHEW
80.0000 mg | CHEWABLE_TABLET | Freq: Three times a day (TID) | ORAL | Status: DC
  Administered 2011-04-02 – 2011-04-05 (×10): 80 mg via ORAL

## 2011-04-02 MED ORDER — PHENYLEPHRINE 40 MCG/ML (10ML) SYRINGE FOR IV PUSH (FOR BLOOD PRESSURE SUPPORT)
PREFILLED_SYRINGE | INTRAVENOUS | Status: AC
  Filled 2011-04-02: qty 10

## 2011-04-02 MED ORDER — MORPHINE SULFATE (PF) 0.5 MG/ML IJ SOLN
INTRAMUSCULAR | Status: DC | PRN
  Administered 2011-04-02: .2 mg via INTRATHECAL

## 2011-04-02 MED ORDER — PROMETHAZINE HCL 25 MG/ML IJ SOLN
INTRAMUSCULAR | Status: AC
  Administered 2011-04-02: 6.25 mg via INTRAVENOUS
  Filled 2011-04-02: qty 1

## 2011-04-02 MED ORDER — SODIUM CHLORIDE 0.9 % IJ SOLN: 3.0000 mL | Freq: Two times a day (BID) | INTRAMUSCULAR | Status: DC

## 2011-04-02 MED ORDER — ZOLPIDEM TARTRATE 5 MG PO TABS: 5.0000 mg | ORAL_TABLET | Freq: Every evening | ORAL | Status: DC | PRN

## 2011-04-02 MED ORDER — METHYLERGONOVINE MALEATE 0.2 MG PO TABS: 0.2000 mg | ORAL_TABLET | ORAL | Status: DC | PRN

## 2011-04-02 MED ORDER — BUPIVACAINE HCL (PF) 0.25 % IJ SOLN
INTRAMUSCULAR | Status: DC | PRN
  Administered 2011-04-02: 7 mL

## 2011-04-02 MED ORDER — ONDANSETRON HCL 4 MG/2ML IJ SOLN
INTRAMUSCULAR | Status: AC
  Filled 2011-04-02: qty 2

## 2011-04-02 MED ORDER — KETOROLAC TROMETHAMINE 60 MG/2ML IM SOLN
60.0000 mg | Freq: Once | INTRAMUSCULAR | Status: AC | PRN
  Administered 2011-04-02: 60 mg via INTRAMUSCULAR

## 2011-04-02 MED ORDER — ENOXAPARIN SODIUM 40 MG/0.4ML ~~LOC~~ SOLN
40.0000 mg | SUBCUTANEOUS | Status: DC
  Administered 2011-04-03 – 2011-04-05 (×3): 40 mg via SUBCUTANEOUS
  Filled 2011-04-02 (×4): qty 0.4

## 2011-04-02 MED ORDER — MEPERIDINE HCL 25 MG/ML IJ SOLN: 6.2500 mg | INTRAMUSCULAR | Status: DC | PRN

## 2011-04-02 MED ORDER — DEXAMETHASONE SODIUM PHOSPHATE 10 MG/ML IJ SOLN
INTRAMUSCULAR | Status: AC
  Filled 2011-04-02: qty 1

## 2011-04-02 MED ORDER — DIPHENHYDRAMINE HCL 25 MG PO CAPS: 25.0000 mg | ORAL_CAPSULE | Freq: Four times a day (QID) | ORAL | Status: DC | PRN

## 2011-04-02 MED ORDER — DIPHENHYDRAMINE HCL 25 MG PO CAPS: 25.0000 mg | ORAL_CAPSULE | ORAL | Status: DC | PRN

## 2011-04-02 MED ORDER — FLEET ENEMA 7-19 GM/118ML RE ENEM: 1.0000 | ENEMA | RECTAL | Status: DC | PRN

## 2011-04-02 MED ORDER — OXYTOCIN 20 UNITS IN LACTATED RINGERS INFUSION - SIMPLE
125.0000 mL/h | INTRAVENOUS | Status: AC
  Administered 2011-04-02: 125 mL/h via INTRAVENOUS

## 2011-04-02 MED ORDER — MORPHINE SULFATE 0.5 MG/ML IJ SOLN
INTRAMUSCULAR | Status: AC
  Filled 2011-04-02: qty 10

## 2011-04-02 MED ORDER — BUPIVACAINE IN DEXTROSE 0.75-8.25 % IT SOLN
INTRATHECAL | Status: DC | PRN
  Administered 2011-04-02: 1.4 mL via INTRATHECAL

## 2011-04-02 MED ORDER — OXYTOCIN 10 UNIT/ML IJ SOLN
INTRAMUSCULAR | Status: AC
  Filled 2011-04-02: qty 4

## 2011-04-02 MED ORDER — DIBUCAINE 1 % RE OINT: 1.0000 "application " | TOPICAL_OINTMENT | RECTAL | Status: DC | PRN

## 2011-04-02 MED ORDER — TETANUS-DIPHTH-ACELL PERTUSSIS 5-2.5-18.5 LF-MCG/0.5 IM SUSP: 0.5000 mL | Freq: Once | INTRAMUSCULAR | Status: DC

## 2011-04-02 MED ORDER — ONDANSETRON HCL 4 MG/2ML IJ SOLN: 4.0000 mg | INTRAMUSCULAR | Status: DC | PRN

## 2011-04-02 MED ORDER — FENTANYL CITRATE 0.05 MG/ML IJ SOLN
INTRAMUSCULAR | Status: DC | PRN
  Administered 2011-04-02: 25 ug via INTRATHECAL

## 2011-04-02 MED ORDER — MORPHINE SULFATE (PF) 0.5 MG/ML IJ SOLN
INTRAMUSCULAR | Status: DC | PRN
  Administered 2011-04-02: 4.8 mg via INTRAVENOUS

## 2011-04-02 MED ORDER — BISACODYL 10 MG RE SUPP: 10.0000 mg | Freq: Every day | RECTAL | Status: DC | PRN

## 2011-04-02 MED ORDER — MENTHOL 3 MG MT LOZG: 1.0000 | LOZENGE | OROMUCOSAL | Status: DC | PRN

## 2011-04-02 MED ORDER — OXYCODONE-ACETAMINOPHEN 5-325 MG PO TABS
1.0000 | ORAL_TABLET | ORAL | Status: DC | PRN
  Administered 2011-04-03 (×2): 2 via ORAL
  Administered 2011-04-03: 1 via ORAL
  Administered 2011-04-04 (×3): 2 via ORAL
  Administered 2011-04-04: 1 via ORAL
  Administered 2011-04-04 – 2011-04-05 (×3): 2 via ORAL
  Filled 2011-04-02 (×2): qty 2
  Filled 2011-04-02: qty 1
  Filled 2011-04-02 (×2): qty 2
  Filled 2011-04-02: qty 1
  Filled 2011-04-02 (×4): qty 2

## 2011-04-02 MED ORDER — KETOROLAC TROMETHAMINE 30 MG/ML IJ SOLN
30.0000 mg | Freq: Four times a day (QID) | INTRAMUSCULAR | Status: AC | PRN
  Administered 2011-04-02 – 2011-04-03 (×3): 30 mg via INTRAVENOUS
  Filled 2011-04-02 (×2): qty 1

## 2011-04-02 MED ORDER — METHYLERGONOVINE MALEATE 0.2 MG/ML IJ SOLN: 0.2000 mg | INTRAMUSCULAR | Status: DC | PRN

## 2011-04-02 MED ORDER — SODIUM CHLORIDE 0.9 % IJ SOLN: 3.0000 mL | INTRAMUSCULAR | Status: DC | PRN

## 2011-04-02 MED ORDER — WITCH HAZEL-GLYCERIN EX PADS: 1.0000 "application " | MEDICATED_PAD | CUTANEOUS | Status: DC | PRN

## 2011-04-02 MED ORDER — SENNOSIDES-DOCUSATE SODIUM 8.6-50 MG PO TABS
2.0000 | ORAL_TABLET | Freq: Every day | ORAL | Status: DC
  Administered 2011-04-02 – 2011-04-04 (×3): 2 via ORAL

## 2011-04-02 MED ORDER — SIMETHICONE 80 MG PO CHEW: 80.0000 mg | CHEWABLE_TABLET | ORAL | Status: DC | PRN

## 2011-04-02 MED ORDER — NALBUPHINE HCL 10 MG/ML IJ SOLN: 5.0000 mg | INTRAMUSCULAR | Status: DC | PRN

## 2011-04-02 MED ORDER — NALOXONE HCL 0.4 MG/ML IJ SOLN: 0.4000 mg | INTRAMUSCULAR | Status: DC | PRN

## 2011-04-02 MED ORDER — FENTANYL CITRATE 0.05 MG/ML IJ SOLN
INTRAMUSCULAR | Status: AC
Start: 2011-04-02 — End: 2011-04-02
  Filled 2011-04-02: qty 2

## 2011-04-02 MED ORDER — FENTANYL CITRATE 0.05 MG/ML IJ SOLN
INTRAMUSCULAR | Status: DC | PRN
  Administered 2011-04-02: 75 ug via INTRAVENOUS

## 2011-04-02 MED ORDER — ONDANSETRON HCL 4 MG/2ML IJ SOLN: 4.0000 mg | Freq: Three times a day (TID) | INTRAMUSCULAR | Status: DC | PRN

## 2011-04-02 MED ORDER — SODIUM CHLORIDE 0.9 % IV SOLN: 250.0000 mL | INTRAVENOUS | Status: DC

## 2011-04-02 MED ORDER — SCOPOLAMINE 1 MG/3DAYS TD PT72
1.0000 | MEDICATED_PATCH | Freq: Once | TRANSDERMAL | Status: DC
  Administered 2011-04-02: 1.5 mg via TRANSDERMAL

## 2011-04-02 MED ORDER — PROMETHAZINE HCL 25 MG/ML IJ SOLN
6.2500 mg | INTRAMUSCULAR | Status: AC | PRN
  Administered 2011-04-02 (×2): 6.25 mg via INTRAVENOUS

## 2011-04-02 MED ORDER — KETOROLAC TROMETHAMINE 30 MG/ML IJ SOLN
30.0000 mg | Freq: Four times a day (QID) | INTRAMUSCULAR | Status: AC | PRN
  Filled 2011-04-02: qty 1

## 2011-04-02 MED ORDER — PROMETHAZINE HCL 25 MG/ML IJ SOLN: 6.2500 mg | INTRAMUSCULAR | Status: DC | PRN

## 2011-04-02 MED ORDER — FENTANYL CITRATE 0.05 MG/ML IJ SOLN
INTRAMUSCULAR | Status: AC
  Filled 2011-04-02: qty 2

## 2011-04-02 MED ORDER — ONDANSETRON HCL 4 MG PO TABS: 4.0000 mg | ORAL_TABLET | ORAL | Status: DC | PRN

## 2011-04-02 MED ORDER — FERROUS SULFATE 325 (65 FE) MG PO TABS
325.0000 mg | ORAL_TABLET | Freq: Two times a day (BID) | ORAL | Status: DC
  Administered 2011-04-03 – 2011-04-05 (×5): 325 mg via ORAL
  Filled 2011-04-02 (×5): qty 1

## 2011-04-02 MED ORDER — BUTALBITAL-APAP-CAFFEINE 50-325-40 MG PO TABS
1.0000 | ORAL_TABLET | Freq: Every day | ORAL | Status: DC | PRN
  Filled 2011-04-02: qty 1

## 2011-04-02 MED ORDER — DIPHENHYDRAMINE HCL 50 MG/ML IJ SOLN: 25.0000 mg | INTRAMUSCULAR | Status: DC | PRN

## 2011-04-02 SURGICAL SUPPLY — 42 items
APL SKNCLS STERI-STRIP NONHPOA (GAUZE/BANDAGES/DRESSINGS) ×1
BENZOIN TINCTURE PRP APPL 2/3 (GAUZE/BANDAGES/DRESSINGS) ×1 IMPLANT
CLOSURE STERI STRIP 1/2 X4 (GAUZE/BANDAGES/DRESSINGS) ×1 IMPLANT
CLOTH BEACON ORANGE TIMEOUT ST (SAFETY) ×2 IMPLANT
CONTAINER PREFILL 10% NBF 15ML (MISCELLANEOUS) ×2 IMPLANT
DRESSING TELFA 8X3 (GAUZE/BANDAGES/DRESSINGS) ×2 IMPLANT
ELECT REM PT RETURN 9FT ADLT (ELECTROSURGICAL) ×2
ELECTRODE REM PT RTRN 9FT ADLT (ELECTROSURGICAL) ×1 IMPLANT
EXTRACTOR VACUUM KIWI (MISCELLANEOUS) IMPLANT
EXTRACTOR VACUUM M CUP 4 TUBE (SUCTIONS) IMPLANT
GAUZE SPONGE 4X4 12PLY STRL LF (GAUZE/BANDAGES/DRESSINGS) ×3 IMPLANT
GLOVE BIO SURGEON STRL SZ 6.5 (GLOVE) ×2 IMPLANT
GLOVE BIOGEL PI IND STRL 7.0 (GLOVE) ×2 IMPLANT
GLOVE BIOGEL PI INDICATOR 7.0 (GLOVE) ×2
GOWN PREVENTION PLUS LG XLONG (DISPOSABLE) ×6 IMPLANT
KIT ABG SYR 3ML LUER SLIP (SYRINGE) IMPLANT
NDL HYPO 25X1 1.5 SAFETY (NEEDLE) ×1 IMPLANT
NDL HYPO 25X5/8 SAFETYGLIDE (NEEDLE) IMPLANT
NEEDLE HYPO 25X1 1.5 SAFETY (NEEDLE) ×2 IMPLANT
NEEDLE HYPO 25X5/8 SAFETYGLIDE (NEEDLE) IMPLANT
NS IRRIG 1000ML POUR BTL (IV SOLUTION) ×2 IMPLANT
PACK C SECTION WH (CUSTOM PROCEDURE TRAY) ×2 IMPLANT
PAD ABD 7.5X8 STRL (GAUZE/BANDAGES/DRESSINGS) ×2 IMPLANT
RTRCTR C-SECT PINK 25CM LRG (MISCELLANEOUS) ×1 IMPLANT
SLEEVE SCD COMPRESS KNEE MED (MISCELLANEOUS) IMPLANT
STAPLER VISISTAT 35W (STAPLE) IMPLANT
STRIP CLOSURE SKIN 1/2X4 (GAUZE/BANDAGES/DRESSINGS) ×1 IMPLANT
SUT CHROMIC GUT AB #0 18 (SUTURE) IMPLANT
SUT MNCRL 0 VIOLET CTX 36 (SUTURE) ×3 IMPLANT
SUT MON AB 4-0 PS1 27 (SUTURE) ×1 IMPLANT
SUT MONOCRYL 0 CTX 36 (SUTURE) ×3
SUT PLAIN 2 0 (SUTURE)
SUT PLAIN ABS 2-0 CT1 27XMFL (SUTURE) IMPLANT
SUT VIC AB 0 CT1 27 (SUTURE) ×4
SUT VIC AB 0 CT1 27XBRD ANBCTR (SUTURE) ×2 IMPLANT
SUT VIC AB 2-0 CT1 27 (SUTURE) ×2
SUT VIC AB 2-0 CT1 TAPERPNT 27 (SUTURE) ×1 IMPLANT
SUT VICRYL 0 TIES 12 18 (SUTURE) IMPLANT
SYR CONTROL 10ML LL (SYRINGE) ×2 IMPLANT
TOWEL OR 17X24 6PK STRL BLUE (TOWEL DISPOSABLE) ×4 IMPLANT
TRAY FOLEY CATH 14FR (SET/KITS/TRAYS/PACK) ×1 IMPLANT
WATER STERILE IRR 1000ML POUR (IV SOLUTION) ×2 IMPLANT

## 2011-04-02 NOTE — Transfer of Care (Signed)
Immediate Anesthesia Transfer of Care Note  Patient: Lynn Cox  Procedure(s) Performed:  CESAREAN SECTION WITH BILATERAL TUBAL LIGATION - Repeat C/S  Patient Location: PACU  Anesthesia Type: Spinal  Level of Consciousness: alert , oriented and sedated  Airway & Oxygen Therapy: Patient Spontanous Breathing  Post-op Assessment: Report given to PACU RN and Post -op Vital signs reviewed and stable  Post vital signs: stable  Complications: No apparent anesthesia complications

## 2011-04-02 NOTE — Anesthesia Postprocedure Evaluation (Signed)
Anesthesia Post Note  Patient: Lynn Cox  Procedure(s) Performed:  CESAREAN SECTION WITH BILATERAL TUBAL LIGATION - Repeat C/S  Anesthesia type: Spinal  Patient location: PACU  Post pain: Pain level controlled  Post assessment: Post-op Vital signs reviewed  Last Vitals:  Filed Vitals:   04/02/11 1215  BP: 125/79  Pulse: 79  Temp:   Resp: 18    Post vital signs: Reviewed  Level of consciousness: awake  Complications: No apparent anesthesia complications

## 2011-04-02 NOTE — Brief Op Note (Signed)
04/02/2011  11:47 AM  PATIENT:  Lynn Cox  25 y.o. female  PRE-OPERATIVE DIAGNOSIS:  Previous C-Section x 2, Desires Sterilization, Protein S Deficiency  POST-OPERATIVE DIAGNOSIS:  Previous Cesarean Section x 2; Desires Sterilization; Protein S Deficiency  PROCEDURE:  Procedure(s):REPEAT CESAREAN SECTION WITH BILATERAL TUBAL LIGATION USING MODIFIED POMEROY   SURGEON:  Surgeon(s): Hanley Rispoli Cathie Beams, MD  PHYSICIAN ASSISTANT:   ASSISTANTS:TANYA BAILEY, CNM  ANESTHESIA:   spinal  Findings: live female LOT nl tubes and ovaries.  8lb 9 oz, Apgar 9/9  EBL:  Total I/O In: 2500 [I.V.:2500] Out: 1050 [Urine:150; Blood:900]  BLOOD ADMINISTERED:none  DRAINS: none   LOCAL MEDICATIONS USED:  MARCAINE 7CC  SPECIMEN:  Source of Specimen:  portion of right and left tube   DISPOSITION OF SPECIMEN:  PATHOLOGY  COUNTS:  YES  TOURNIQUET:  * No tourniquets in log *  DICTATION: .Other Dictation: Dictation Number  [id number] PLAN OF CARE: Admit to inpatient   PATIENT DISPOSITION:  PACU - hemodynamically stable.   Delay start of Pharmacological VTE agent (>24hrs) due to surgical blood loss or risk of bleeding:  no

## 2011-04-02 NOTE — Progress Notes (Signed)
Dr Cherly Hensen called to confirm that Toradol was okay to give to patient.  Will continue to monitor.  Earl Gala, Linda Hedges [place]

## 2011-04-02 NOTE — Progress Notes (Signed)
Patient has home med of Lovenox and first dose post op is due tomorrow at noon.  Patient having pain of 7-8 out of 10. Pt states that she does not desire stronger meds such as morphine or dilaudid in PCA pump.  Toradol given in PACU and Platelets 179; however, pharmacy discontinued ibuprofen prn dose and md never placed order for scheduled dose of ibuprofen.  Called MD to verify if Toradol is ok to give to patient since she is on Lovenox.  Paged Dr. Billy Coast with no response.  Called Dr. Jorene Minors cell phone and OR RN answered phone and relayed message to Dr. Billy Coast and Dr. Cherly Hensen.  OR RN stated that Dr. Cherly Hensen would call back with med order decision.  Notified patient that MD in surgery and will decide when able to.  Patient continues to rest quietly with eyes closed due to previous nausea and phenergan dose in PACU.  Patient seems comfortable and is okay with waiting for MD response.  Will continue to monitor closely.  Earl Gala, Linda Hedges [place]

## 2011-04-02 NOTE — Anesthesia Procedure Notes (Addendum)
Spinal Block  Patient location during procedure: OR Start time: 04/02/2011 10:46 AM End time: 04/02/2011 10:52 AM Staffing Anesthesiologist: Sandrea Hughs Performed by: anesthesiologist  Preanesthetic Checklist Completed: patient identified, site marked, surgical consent, pre-op evaluation, timeout performed, IV checked, risks and benefits discussed and monitors and equipment checked Spinal Block Patient position: sitting Prep: DuraPrep Patient monitoring: heart rate, cardiac monitor, continuous pulse ox and blood pressure Approach: midline Location: L3-4 Injection technique: single-shot Needle Needle type: Sprotte  Needle gauge: 24 G Needle length: 9 cm Needle insertion depth: 7 cm Assessment Sensory level: T4

## 2011-04-02 NOTE — Consult Note (Signed)
Neonatology Note:  Attendance at C-section:  I was asked to attend this repeat C/S at term. The mother is a G6P2A3 A neg, Rubella equivocal, GBS unknown. ROM at delivery, fluid clear. Infant vigorous with good spontaneous cry and tone. Needed only minimal bulb suctioning. Ap 9/9. Lungs clear to ausc in DR. To CN to care of Pediatrician.  Beatrice Sehgal, MD  

## 2011-04-02 NOTE — Anesthesia Preprocedure Evaluation (Signed)
Anesthesia Evaluation  Patient identified by MRN, date of birth, ID band Patient awake  General Assessment Comment  Reviewed: Allergy & Precautions, H&P , Patient's Chart, lab work & pertinent test results  Airway Mallampati: I TM Distance: >3 FB Neck ROM: full    Dental No notable dental hx.    Pulmonary    Pulmonary exam normal       Cardiovascular     Neuro/Psych Negative Neurological ROS  Negative Psych ROS   GI/Hepatic negative GI ROS Neg liver ROS    Endo/Other  Negative Endocrine ROS  Renal/GU negative Renal ROS  Genitourinary negative   Musculoskeletal negative musculoskeletal ROS (+)   Abdominal Normal abdominal exam  (+)   Peds negative pediatric ROS (+)  Hematology negative hematology ROS (+)   Anesthesia Other Findings   Reproductive/Obstetrics (+) Pregnancy                           Anesthesia Physical Anesthesia Plan  ASA: II  Anesthesia Plan: Spinal   Post-op Pain Management:    Induction:   Airway Management Planned:   Additional Equipment:   Intra-op Plan:   Post-operative Plan:   Informed Consent: I have reviewed the patients History and Physical, chart, labs and discussed the procedure including the risks, benefits and alternatives for the proposed anesthesia with the patient or authorized representative who has indicated his/her understanding and acceptance.     Plan Discussed with: CRNA  Anesthesia Plan Comments:         Anesthesia Quick Evaluation  

## 2011-04-03 LAB — CBC
HCT: 32.9 % — ABNORMAL LOW (ref 36.0–46.0)
Hemoglobin: 10.7 g/dL — ABNORMAL LOW (ref 12.0–15.0)
MCH: 27.2 pg (ref 26.0–34.0)
MCHC: 32.5 g/dL (ref 30.0–36.0)
MCV: 83.5 fL (ref 78.0–100.0)
Platelets: 144 10*3/uL — ABNORMAL LOW (ref 150–400)
RBC: 3.94 MIL/uL (ref 3.87–5.11)
RDW: 14.8 % (ref 11.5–15.5)
WBC: 11.6 10*3/uL — ABNORMAL HIGH (ref 4.0–10.5)

## 2011-04-03 LAB — CCBB MATERNAL DONOR DRAW

## 2011-04-03 MED ORDER — RHO D IMMUNE GLOBULIN 1500 UNIT/2ML IJ SOLN
300.0000 ug | Freq: Once | INTRAMUSCULAR | Status: AC
Start: 2011-04-03 — End: 2011-04-03
  Administered 2011-04-03: 300 ug via INTRAMUSCULAR
  Filled 2011-04-03: qty 2

## 2011-04-03 NOTE — Addendum Note (Signed)
Addendum  created 04/03/11 0945 by Cephus Shelling   Modules edited:Notes Section

## 2011-04-03 NOTE — Op Note (Signed)
Lynn Cox, Lynn Cox              ACCOUNT NO.:  [id number]  MEDICAL RECORD NO.:  [id number]  LOCATION:  9126                          FACILITY:  [facility]  PHYSICIAN:  Maxie Better, M.D.DATE OF BIRTH:  02-22-1986  DATE OF PROCEDURE:  04/02/2011 DATE OF DISCHARGE:                              OPERATIVE REPORT   PREOPERATIVE DIAGNOSES:  Term gestation, previous cesarean section x2, desires sterilization, protein S deficiency.  POSTOPERATIVE DIAGNOSES:  Previous cesarean section x2, term gestation, desires sterilization, protein S deficiency.  PROCEDURE:  Repeat cesarean section, Sharl Ma hysterotomy, modified Pomeroy bilateral tubal ligation.  ANESTHESIA:  Spinal.  SURGEON:  Maxie Better, MD  ASSISTANT:  Marlinda Mike, CNM  PROCEDURE:  Under adequate spinal anesthesia, the patient was placed in the supine position with a left lateral tilt.  She was sterilely prepped and draped in usual fashion.  Indwelling Foley catheter was sterilely placed.  A 0.25% Marcaine was injected along the previous Pfannenstiel skin incision.  Pfannenstiel skin incision was then made, carried down to the rectus fascia.  The rectus fascia was opened transversely.  The rectus fascia with sharp and blunt dissection was dissected off the rectus muscle in superior and inferior fashion.  The patient has some diastasis.  Careful incision of the midline resulting that the parietal peritoneum being incidentally opened and this incision extended under direct visualization.  The vesicouterine peritoneum was adherent, the lower uterine segment was very thin, basically covered by a very thin layer of the lower uterine segment and the bladder.  Cutting through that area resulted an artificial rupture of membranes, copious clear amniotic fluid, and then blunt dissection occurred with the incision being opened transversely.  Subsequent delivery of a live female from the left occiput transverse position was  accomplished.  The baby was bulb suctioned in the abdomen, the cord was clamped and cut.  The baby was transferred to the awaiting pediatrician who assigned Apgars of 9 at 9 and 1 at 5 minutes.  The placenta was fundally, which was manually removed, intact, and not sent to pathology.  Uterine cavity cleaned of debris.  Uterine incision had no extension.  The lower packed segment because of the un-development of the vesicouterine peritoneum, sharp dissection was then performed to take the bladder down off of the lower uterine segment prior to closing the uterine incision.  This was done, the uterine incision was then closed in 2 layers, the first layer of 0 Monocryl running locked stitch, second layer was imbricated using 0 Monocryl suture with good hemostasis noted.  Attention was then turned to the fallopian tube on the left, which was identified down to the fimbriated end.  The distal portion of that fallopian tube on the left was adherent to the ovary and that was lysed.  The midportion of the fallopian tube was grasped with a Babcock, the underlying mesosalpinx was opened with cautery.  The proximal and distal portion of that tube was then tied with 0 chromic suture x2 proximally and distally and the intervenous segment of tube was then removed.  The ovary was normal.  On the right, there was a large amount of varicosity in the right mesosalpinx, the ovary was enlarged, but  otherwise normal.  The midportion of the right fallopian tube was grasped with a Babcock.  The underlying mesosalpinx was subsequently opened.  The proximal and distal portion of tube was tied with 0 chromic suture x2 proximally and distally and the intervenous segment of tube was then removed.  The abdomen was then irrigated, suctioned of debris and uterine incision of good hemostasis.  The parietal peritoneum was then closed with 2-0 Vicryl.  The rectus fascia was closed with 0 Vicryl x2.  The subcutaneous area  was very thin in depth and therefore, not additionally closed.  Small bleeders were cauterized.  The skin incision was approximated using subcuticular 4-0 Monocryl suture.  Steri-Strips were then placed.  Specimen of the portion of right and left fallopian tubes were sent to pathology.  Placenta was not sent.  Estimated blood loss was 900 mL.  Intraoperative fluid 2500 mL.  Urine output 150 mL of clear yellow urine.  Sponge and instrument counts x2 was correct. Complication was none.  Weight of the baby was 8 pounds and 9 ounces. The patient tolerated the procedure well, and was transferred to recovery room in stable condition.     Maxie Better, M.D.     [place]/MEDQ  D:  04/02/2011  T:  04/03/2011  Job:  [id number]

## 2011-04-03 NOTE — Anesthesia Postprocedure Evaluation (Signed)
Anesthesia Post Note  Patient: Lynn Cox  Procedure(s) Performed:  CESAREAN SECTION WITH BILATERAL TUBAL LIGATION - Repeat C/S  Anesthesia type: Spinal  Patient location: Mother/Baby  Post pain: Pain level controlled  Post assessment: Post-op Vital signs reviewed  Last Vitals:  Filed Vitals:   04/03/11 0755  BP: 114/80  Pulse: 83  Temp: 36.4 C  Resp: 16    Post vital signs: Reviewed  Level of consciousness: awake  Complications: No apparent anesthesia complications

## 2011-04-03 NOTE — Progress Notes (Addendum)
  S:         Reports feeling ok / + soreness             Tolerating po intake / no nausea this am / no vomiting since PACU / no flatus / no BM             Bleeding is spotting             Pain controlledprescription NSAID's including motrin and narcotic analgesics including percocet             Up ad lib / ambulatory to bathroom  Newborn breast feeding  / Circumcision requested   O:  A & O x 3 NAD             VS: Blood pressure 114/67, pulse 64, temperature 97.6 F (36.4 C), temperature source Oral, resp. rate 16, weight 84.369 kg (186 lb), last menstrual period 06/24/2010, SpO2 96.00%, unknown if currently breastfeeding.  LABS:  Lab Results  Component Value Date   WBC 11.6* 04/03/2011   HGB 10.7* 04/03/2011   HCT 32.9* 04/03/2011   MCV 83.5 04/03/2011   PLT 144* 04/03/2011     I&O: I/O last 3 completed shifts: In: 4033.8 [P.O.:240; I.V.:3743.8; IV Piggyback:50] Out: 2800 [Urine:1900; Blood:900]      Lungs: Clear and unlabored  Heart: regular rate and rhythm / no mumurs  Abdomen: soft, non-tender, non-distended, bowel sounds active             Fundus: firm, non-tender, Ueven             Dressing intact without drainage              Perineum: no edema  Lochia: light  Extremities: trace edema, no calf pain or tenderness  A:        POD # 1 S/P cesarean section repeat (#3) with BTL            Protein S deficiency            Stable status  P:        Routine postoperative care  - Lovenox restart per post-op orders             Advance activity - shower and d/c dressing today             May consider early discharge tomorrow     Lynn Cox 04/03/2011, 9:34 AM

## 2011-04-04 LAB — RH IG WORKUP (INCLUDES ABO/RH)
ABO/RH(D): A NEG
Fetal Screen: NEGATIVE
Gestational Age(Wks): 39.2
Unit division: 0

## 2011-04-04 NOTE — Progress Notes (Addendum)
  POD # 2  Subjective: Pt reports feeling well/ Pain controlled with prescription NSAID's including motrin and narcotic analgesics including percocet Declines early d/c home today Tolerating po/Voiding without problems/ No n/v/Flatus posActivity: ad lib Bleeding is spotting Newborn info:  Information for the patient's newborn:  Jacolyn, Joaquin [[id number]]  female  / circ complete/ Feeding: bottle   Objective: VS: Blood pressure 120/77, pulse 74, temperature 97.9 F (36.6 C), temperature source Oral, resp. rate 18  Physical Exam:  General: alert, cooperative and no distress CV: Regular rate and rhythm Resp: clear Abdomen: soft, nontender, normal bowel sounds Incision: LT and clean, dry and intact; closed with subcuticular closure Uterine Fundus: firm, below umbilicus, nontender Perineum: not inspected Ext: Homans sign is negative, no sign of DVT and no edema, redness or tenderness in the calves or thighs    A/P: POD # 2/ [id number] S/P C/S and BTL Hx Protein S deficiency; on Lovenox Doing well Continue routine post op orders Anticipate discharge home in the am

## 2011-04-05 MED ORDER — TRAMADOL HCL 50 MG PO TABS: 50.0000 mg | ORAL_TABLET | Freq: Four times a day (QID) | ORAL | Status: AC | PRN

## 2011-04-05 MED ORDER — OXYCODONE-ACETAMINOPHEN 5-325 MG PO TABS: 1.0000 | ORAL_TABLET | ORAL | Status: AC | PRN

## 2011-04-05 NOTE — Progress Notes (Addendum)
  S:         Reports feeling lots of pain and backache this am                no analgesia since 0200 per pt / just took percocet in past 15 minutes with breakfast             Tolerating po intake / no nausea / no vomiting / + flatus / no BM             Bleeding is light             Pain minimally controlled withnarcotic analgesics including percocet              Up ad lib / ambulatory  Newborn breast feeding  / Circumcision done   O:  A & O x 3 Uncomfortable - sitting in chair             VS: Blood pressure 120/74, pulse 88, temperature 98 F (36.7 C), temperature source Oral, resp. rate 18, weight 84.369 kg (186 lb), last menstrual period 06/24/2010, SpO2 96.00%, unknown if currently breastfeeding.  LABS:  Lab Results  Component Value Date   WBC 11.6* 04/03/2011   HGB 10.7* 04/03/2011   HCT 32.9* 04/03/2011   MCV 83.5 04/03/2011   PLT 144* 04/03/2011     Lungs: Clear and unlabored  Heart: regular rate and rhythm / no mumurs  Abdomen: soft, non-tender, non-distended, active BS             Fundus: firm, non-tender, U-1             Dressing OFF              Incision:  approximated with suture and steristrips / no erythema /no ecchymosis /no drainage  Perineum: no edema  Lochia: light  Extremities: trace edema, no calf pain or tenderness, negative Homans  A:        POD # 3 S/P repeat c-section with BTL            Protein S deficiency  P:        Routine postoperative care              Discharge home this pm - when pain better controlled             Add Ultram for pain between percocet doses             Lovenox x 6 weeks - follow-up with Hematology as directed     [facility] 04/05/2011, 9:41 AM

## 2011-04-05 NOTE — Discharge Summary (Signed)
Obstetric Discharge Summary Reason for Admission: cesarean section scheduled repeat with BTL Prenatal Procedures: NST, ultrasound and Lovenox- Heparin prophylaxis (Protein S deficiency) Intrapartum Procedures: cesarean: low cervical, transverse and tubal ligation Postpartum Procedures: none Complications-Operative and Postpartum: none HGB  Date Value Range Status  03/08/2011 10.9* 11.6-15.9 (g/dL) Final     Hemoglobin  Date Value Range Status  04/03/2011 10.7* 12.0-15.0 (g/dL) Final     HCT  Date Value Range Status  04/03/2011 32.9* 36.0-46.0 (%) Final  03/08/2011 32.1* 34.8-46.6 (%) Final    Discharge Diagnoses: term pregnancy / repeat cesarean section / bilateral tubal sterilization / protein S deficiency  Discharge Information: Date: 04/05/2011 Activity: pelvic rest and postoperative restrictions x 2 weeks Diet: routine Medications: PNV, Percocet and Ultram prn pain and Lovenox x 6 weeks Condition: stable Instructions: refer to practice specific booklet Discharge to: home Follow-up Information    Follow up with COUSINS,SHERONETTE A, MD. Make an appointment in 6 weeks.   Contact information:   [address] [place] [address] [phone]          Newborn Data: Live born female  Birth Weight: 8 lb 9.7 oz (3904 g) APGAR: 9, 9  Home with mother.  Issiac Jamar 04/05/2011, 9:48 AM

## 2011-10-23 ENCOUNTER — Encounter (INDEPENDENT_AMBULATORY_CARE_PROVIDER_SITE_OTHER): Payer: Self-pay | Admitting: General Surgery

## 2011-10-23 ENCOUNTER — Ambulatory Visit (INDEPENDENT_AMBULATORY_CARE_PROVIDER_SITE_OTHER): Payer: 59 | Admitting: General Surgery

## 2011-10-23 VITALS — BP 120/82 | Temp 97.9°F | Ht 62.0 in | Wt 140.4 lb

## 2011-10-23 DIAGNOSIS — K429 Umbilical hernia without obstruction or gangrene: Secondary | ICD-10-CM

## 2011-10-23 DIAGNOSIS — M6208 Separation of muscle (nontraumatic), other site: Secondary | ICD-10-CM | POA: Insufficient documentation

## 2011-10-23 DIAGNOSIS — M6200 Separation of muscle (nontraumatic), unspecified site: Secondary | ICD-10-CM

## 2011-10-23 NOTE — Progress Notes (Signed)
HPI The patient comes in with a complaint of periumbilical pain and discomfort. She has noticed this for a number of years. Most prominent since her last pregnancy 6 months ago.  The patient has been told that she has a diastasis recti and as part of her concerns. She is considering having surgery for that.  PE On examination she has a very significant diastases recti with a very small umbilical hernia defect. This is mildly tender on examination today however her biggest concern appears to be the diastases recti  Studiy review There are no studies to review.  Assessment #1. The diastases recti.  #2. Small umbilical hernia.  Plan I have told the patient that if her biggest complaint is periumbilical pain related to her umbilical hernia that I would be happy to repair it however it seems as though her biggest concern was the midline bulge from the diastases recti. If she is considering surgery for that then the umbilical hernia defect can be repaired at the same time  No further plans for surgery at this time.

## 2014-04-05 ENCOUNTER — Encounter (INDEPENDENT_AMBULATORY_CARE_PROVIDER_SITE_OTHER): Payer: Self-pay | Admitting: General Surgery

## 2017-12-31 ENCOUNTER — Other Ambulatory Visit: Payer: Self-pay | Admitting: General Surgery

## 2017-12-31 DIAGNOSIS — N6320 Unspecified lump in the left breast, unspecified quadrant: Secondary | ICD-10-CM

## 2018-01-02 DIAGNOSIS — N6320 Unspecified lump in the left breast, unspecified quadrant: Secondary | ICD-10-CM

## 2018-01-02 HISTORY — DX: Unspecified lump in the left breast, unspecified quadrant: N63.20

## 2018-01-10 ENCOUNTER — Other Ambulatory Visit: Payer: Self-pay

## 2018-01-10 ENCOUNTER — Encounter (HOSPITAL_BASED_OUTPATIENT_CLINIC_OR_DEPARTMENT_OTHER): Payer: Self-pay | Admitting: *Deleted

## 2018-01-10 NOTE — Pre-Procedure Instructions (Addendum)
To come for CBC, diff, CMET; to pick up Ensure pre-surgery drink 10 oz. - to drink by 0500 DOS. Hx. of protein S deficiency discussed with Dr. Caryl Comes; pt. OK to come for surgery.

## 2018-01-11 NOTE — H&P (Signed)
Lynn Cox Location: [facility] Patient #: [id number] DOB: Aug 05, 1985 Married / Language: English / Race: Black or African American Female       History of Present Illness       This is a very pleasant 32 year old female who presents with a palpable mass of the left breast at 4 o'clock position she is referred by Dr. cousins, her gynecologist. Imaging studies and pathology was performed at [facility]. She is from [place] and wants to have her surgery here. Lynn Cox served chaperone throughout the encounter     She felt a lump in her left breast since December. She says this fluctuates in size a little bit but not very painful. 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 The radiologis at [facility] that recommended this was Lynn Gaudier, MD.    Past history is mostly unremarkable. 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 Family history is negative for breast or ovarian cancer She is married and lives in [place] has 3 children and works from home for an [employer]. Denies tobacco. Drinks alcohol rarely     She clearly wants this palpable mass removed. That is reasonable. She'll be scheduled for left breast lumpectomy with radioactive seed localization  She agrees with this plan.   Allergies  No Known Drug Allergies  Allergies Reconciled   Medication History  FLUoxetine HCl (20MG  Capsule, Oral) Active.  Vitals  Weight: 168.5 lb Height: 62in Body Surface Area: 1.78 m Body Mass Index: 30.82 kg/m  Temp.: 98.46F(Oral)  Pulse: 42 (Regular)  BP: 124/80 (Sitting, Left Arm, Standard)     Physical Exam  General Mental Status-Alert. General Appearance-Consistent with stated  age. Hydration-Well hydrated. Voice-Normal.  Head and Neck Head-normocephalic, atraumatic with no lesions or palpable masses. Trachea-midline. Thyroid Gland Characteristics - normal size and consistency.  Eye Eyeball - Bilateral-Extraocular movements intact. Sclera/Conjunctiva - Bilateral-No scleral icterus.  Chest and Lung Exam Chest and lung exam reveals -quiet, even and easy respiratory effort with no use of accessory muscles and on auscultation, normal breath sounds, no adventitious sounds and normal vocal resonance. Inspection Chest Wall - Normal. Back - normal.  Breast Note: 2 cm smooth mobile mass in the left breast at the 4 o'clock position. A centimeter or 2 outside the areolar margin. No skin changes. No mass in either breast. No axillary adenopathy. There is an old biopsy scar in the right breast at the 1 o'clock position centrally. There is an additional biopsy scar in the left breast at the 2 o'clock position peripherally.   Cardiovascular Cardiovascular examination reveals -normal heart sounds, regular rate and rhythm with no murmurs and normal pedal pulses bilaterally.  Abdomen Inspection Inspection of the abdomen reveals - No Hernias. Skin - Scar - no surgical scars. Palpation/Percussion Palpation and Percussion of the abdomen reveal - Soft, Non Tender, No Rebound tenderness, No Rigidity (guarding) and No hepatosplenomegaly. Auscultation Auscultation of the abdomen reveals - Bowel sounds normal.  Neurologic Neurologic evaluation reveals -alert and oriented x 3 with no impairment of recent or remote memory. Mental Status-Normal.  Musculoskeletal Normal Exam - Left-Upper Extremity Strength Normal and Lower Extremity Strength Normal. Normal Exam - Right-Upper Extremity Strength Normal and Lower Extremity Strength Normal.  Lymphatic Head & Neck  General Head & Neck  Lymphatics: Bilateral - Description - Normal. Axillary  General  Axillary Region: Bilateral - Description - Normal. Tenderness - Non Tender. Femoral & Inguinal  Generalized Femoral & Inguinal Lymphatics: Bilateral - Description - Normal. Tenderness - Non Tender.    Assessment & Plan  LEFT BREAST MASS (N63.20)   You have a palpable mass in the left breast at the 4 o'clock position Biopsies suggest a fibroadenoma but the radiologist has recommended excision to rule out phyllodes tumor This is reasonable and you state this is what you want to do  you will be scheduled for left breast lumpectomy with radioactive seed localization I have discussed the indications, details, techniques, and numerous risk of the surgery with you.    Lynn Cox. Lynn Cox, M.D., [facility] General and Minimally invasive Surgery Breast and Colorectal Surgery Office:   [phone] Pager:   [phone]

## 2018-01-14 ENCOUNTER — Encounter (HOSPITAL_BASED_OUTPATIENT_CLINIC_OR_DEPARTMENT_OTHER)
Admission: RE | Admit: 2018-01-14 | Discharge: 2018-01-14 | Disposition: A | Discharge status: Home / Self Care | Payer: Managed Care, Other (non HMO) | Source: Ambulatory Visit | Attending: General Surgery | Admitting: General Surgery

## 2018-01-14 DIAGNOSIS — N6323 Unspecified lump in the left breast, lower outer quadrant: Secondary | ICD-10-CM | POA: Diagnosis present

## 2018-01-14 DIAGNOSIS — D4862 Neoplasm of uncertain behavior of left breast: Secondary | ICD-10-CM | POA: Diagnosis not present

## 2018-01-14 LAB — COMPREHENSIVE METABOLIC PANEL
ALT: 15 U/L (ref 0–44)
AST: 20 U/L (ref 15–41)
Albumin: 3.9 g/dL (ref 3.5–5.0)
Alkaline Phosphatase: 40 U/L (ref 38–126)
Anion gap: 5 (ref 5–15)
BUN: 10 mg/dL (ref 6–20)
CO2: 28 mmol/L (ref 22–32)
Calcium: 8.9 mg/dL (ref 8.9–10.3)
Chloride: 107 mmol/L (ref 98–111)
Creatinine, Ser: 0.72 mg/dL (ref 0.44–1.00)
GFR calc Af Amer: >60 mL/min (ref >60)
GFR calc non Af Amer: >60 mL/min (ref >60)
Glucose, Bld: 68 mg/dL — ABNORMAL LOW (ref 70–99)
Potassium: 3.8 mmol/L (ref 3.5–5.1)
Sodium: 140 mmol/L (ref 135–145)
Total Bilirubin: 0.7 mg/dL (ref 0.3–1.2)
Total Protein: 6.6 g/dL (ref 6.5–8.1)

## 2018-01-14 LAB — CBC WITH DIFFERENTIAL/PLATELET
Abs Immature Granulocytes: 0 10*3/uL (ref 0.0–0.1)
Basophils Absolute: 0.1 10*3/uL (ref 0.0–0.1)
Basophils Relative: 1 %
Eosinophils Absolute: 0.1 10*3/uL (ref 0.0–0.7)
Eosinophils Relative: 1 %
HCT: 39.2 % (ref 36.0–46.0)
Hemoglobin: 12.3 g/dL (ref 12.0–15.0)
Immature Granulocytes: 0 %
Lymphocytes Relative: 27 %
Lymphs Abs: 2 10*3/uL (ref 0.7–4.0)
MCH: 26.8 pg (ref 26.0–34.0)
MCHC: 31.4 g/dL (ref 30.0–36.0)
MCV: 85.4 fL (ref 78.0–100.0)
Monocytes Absolute: 0.6 10*3/uL (ref 0.1–1.0)
Monocytes Relative: 8 %
Neutro Abs: 4.6 10*3/uL (ref 1.7–7.7)
Neutrophils Relative %: 63 %
Platelets: 270 10*3/uL (ref 150–400)
RBC: 4.59 MIL/uL (ref 3.87–5.11)
RDW: 14.9 % (ref 11.5–15.5)
WBC: 7.4 10*3/uL (ref 4.0–10.5)

## 2018-01-14 NOTE — Progress Notes (Signed)
Ensure pre surgery drink given with instructions to complete by 0500 dos, pt verbalized understanding. 

## 2018-01-16 ENCOUNTER — Ambulatory Visit
Admission: RE | Admit: 2018-01-16 | Discharge: 2018-01-16 | Disposition: A | Discharge status: Home / Self Care | Payer: Managed Care, Other (non HMO) | Source: Ambulatory Visit | Attending: General Surgery | Admitting: General Surgery

## 2018-01-16 ENCOUNTER — Other Ambulatory Visit: Payer: Self-pay | Admitting: General Surgery

## 2018-01-16 DIAGNOSIS — N6320 Unspecified lump in the left breast, unspecified quadrant: Secondary | ICD-10-CM

## 2018-01-17 ENCOUNTER — Ambulatory Visit (HOSPITAL_BASED_OUTPATIENT_CLINIC_OR_DEPARTMENT_OTHER): Payer: Managed Care, Other (non HMO) | Admitting: Certified Registered Nurse Anesthetist

## 2018-01-17 ENCOUNTER — Encounter (HOSPITAL_BASED_OUTPATIENT_CLINIC_OR_DEPARTMENT_OTHER): Payer: Self-pay

## 2018-01-17 ENCOUNTER — Ambulatory Visit
Admission: RE | Admit: 2018-01-17 | Discharge: 2018-01-17 | Disposition: A | Discharge status: Home / Self Care | Payer: Managed Care, Other (non HMO) | Source: Ambulatory Visit | Attending: General Surgery | Admitting: General Surgery

## 2018-01-17 ENCOUNTER — Other Ambulatory Visit: Payer: Self-pay

## 2018-01-17 ENCOUNTER — Encounter (HOSPITAL_BASED_OUTPATIENT_CLINIC_OR_DEPARTMENT_OTHER)
Admission: RE | Disposition: A | Discharge status: Home / Self Care | Payer: Self-pay | Source: Ambulatory Visit | Attending: General Surgery

## 2018-01-17 ENCOUNTER — Ambulatory Visit (HOSPITAL_BASED_OUTPATIENT_CLINIC_OR_DEPARTMENT_OTHER)
Admission: RE | Admit: 2018-01-17 | Discharge: 2018-01-17 | Disposition: A | Discharge status: Home / Self Care | Payer: Managed Care, Other (non HMO) | Source: Ambulatory Visit | Attending: General Surgery | Admitting: General Surgery

## 2018-01-17 DIAGNOSIS — N6320 Unspecified lump in the left breast, unspecified quadrant: Secondary | ICD-10-CM

## 2018-01-17 DIAGNOSIS — D4862 Neoplasm of uncertain behavior of left breast: Secondary | ICD-10-CM | POA: Diagnosis not present

## 2018-01-17 HISTORY — PX: BREAST LUMPECTOMY WITH RADIOACTIVE SEED LOCALIZATION: SHX6424

## 2018-01-17 HISTORY — DX: Other primary thrombophilia: D68.59

## 2018-01-17 HISTORY — DX: Unspecified lump in the left breast, unspecified quadrant: N63.20

## 2018-01-17 LAB — POCT PREGNANCY, URINE: Preg Test, Ur: NEGATIVE

## 2018-01-17 SURGERY — BREAST LUMPECTOMY WITH RADIOACTIVE SEED LOCALIZATION
Anesthesia: General | Site: Breast | Laterality: Left

## 2018-01-17 MED ORDER — METOCLOPRAMIDE HCL 5 MG/ML IJ SOLN: 10.0000 mg | Freq: Once | INTRAMUSCULAR | Status: DC | PRN

## 2018-01-17 MED ORDER — KETOROLAC TROMETHAMINE 30 MG/ML IJ SOLN
INTRAMUSCULAR | Status: AC
  Filled 2018-01-17: qty 1

## 2018-01-17 MED ORDER — SCOPOLAMINE 1 MG/3DAYS TD PT72: 1.0000 | MEDICATED_PATCH | Freq: Once | TRANSDERMAL | Status: DC | PRN

## 2018-01-17 MED ORDER — GABAPENTIN 300 MG PO CAPS
300.0000 mg | ORAL_CAPSULE | ORAL | Status: AC
  Administered 2018-01-17: 300 mg via ORAL

## 2018-01-17 MED ORDER — CHLORHEXIDINE GLUCONATE CLOTH 2 % EX PADS: 6.0000 | MEDICATED_PAD | Freq: Once | CUTANEOUS | Status: DC

## 2018-01-17 MED ORDER — SODIUM BICARBONATE 4 % IV SOLN
INTRAVENOUS | Status: AC
  Filled 2018-01-17: qty 5

## 2018-01-17 MED ORDER — LACTATED RINGERS IV SOLN: INTRAVENOUS | Status: DC

## 2018-01-17 MED ORDER — HEPARIN (PORCINE) IN NACL 1000-0.9 UT/500ML-% IV SOLN
INTRAVENOUS | Status: AC
  Filled 2018-01-17: qty 500

## 2018-01-17 MED ORDER — IOPAMIDOL (ISOVUE-300) INJECTION 61%
INTRAVENOUS | Status: AC
  Filled 2018-01-17: qty 50

## 2018-01-17 MED ORDER — BUPIVACAINE-EPINEPHRINE (PF) 0.5% -1:200000 IJ SOLN
INTRAMUSCULAR | Status: DC | PRN
  Administered 2018-01-17: 10 mL via PERINEURAL

## 2018-01-17 MED ORDER — KETOROLAC TROMETHAMINE 30 MG/ML IJ SOLN
INTRAMUSCULAR | Status: DC | PRN
  Administered 2018-01-17: 30 mg via INTRAVENOUS

## 2018-01-17 MED ORDER — MIDAZOLAM HCL 5 MG/5ML IJ SOLN
INTRAMUSCULAR | Status: DC | PRN
  Administered 2018-01-17: 2 mg via INTRAVENOUS

## 2018-01-17 MED ORDER — CELECOXIB 200 MG PO CAPS
ORAL_CAPSULE | ORAL | Status: AC
  Filled 2018-01-17: qty 1

## 2018-01-17 MED ORDER — LIDOCAINE-EPINEPHRINE (PF) 1 %-1:200000 IJ SOLN
INTRAMUSCULAR | Status: AC
  Filled 2018-01-17: qty 30

## 2018-01-17 MED ORDER — PROPOFOL 10 MG/ML IV BOLUS
INTRAVENOUS | Status: DC | PRN
  Administered 2018-01-17: 200 mg via INTRAVENOUS

## 2018-01-17 MED ORDER — ACETAMINOPHEN 500 MG PO TABS
ORAL_TABLET | ORAL | Status: AC
  Filled 2018-01-17: qty 2

## 2018-01-17 MED ORDER — LIDOCAINE 2% (20 MG/ML) 5 ML SYRINGE
INTRAMUSCULAR | Status: DC | PRN
  Administered 2018-01-17: 100 mg via INTRAVENOUS

## 2018-01-17 MED ORDER — MEPERIDINE HCL 25 MG/ML IJ SOLN: 6.2500 mg | INTRAMUSCULAR | Status: DC | PRN

## 2018-01-17 MED ORDER — FENTANYL CITRATE (PF) 100 MCG/2ML IJ SOLN: 50.0000 ug | INTRAMUSCULAR | Status: DC | PRN

## 2018-01-17 MED ORDER — BUPIVACAINE-EPINEPHRINE (PF) 0.25% -1:200000 IJ SOLN
INTRAMUSCULAR | Status: AC
  Filled 2018-01-17: qty 90

## 2018-01-17 MED ORDER — HEPARIN SOD (PORK) LOCK FLUSH 100 UNIT/ML IV SOLN
INTRAVENOUS | Status: AC
  Filled 2018-01-17: qty 5

## 2018-01-17 MED ORDER — FENTANYL CITRATE (PF) 100 MCG/2ML IJ SOLN
INTRAMUSCULAR | Status: AC
  Filled 2018-01-17: qty 2

## 2018-01-17 MED ORDER — MIDAZOLAM HCL 2 MG/2ML IJ SOLN: 1.0000 mg | INTRAMUSCULAR | Status: DC | PRN

## 2018-01-17 MED ORDER — FENTANYL CITRATE (PF) 100 MCG/2ML IJ SOLN: 25.0000 ug | INTRAMUSCULAR | Status: DC | PRN

## 2018-01-17 MED ORDER — ONDANSETRON HCL 4 MG/2ML IJ SOLN
INTRAMUSCULAR | Status: AC
  Filled 2018-01-17: qty 2

## 2018-01-17 MED ORDER — LACTATED RINGERS IV SOLN
INTRAVENOUS | Status: DC
  Administered 2018-01-17: 08:00:00 via INTRAVENOUS

## 2018-01-17 MED ORDER — LIDOCAINE 2% (20 MG/ML) 5 ML SYRINGE
INTRAMUSCULAR | Status: AC
  Filled 2018-01-17: qty 5

## 2018-01-17 MED ORDER — DEXAMETHASONE SODIUM PHOSPHATE 10 MG/ML IJ SOLN
INTRAMUSCULAR | Status: AC
  Filled 2018-01-17: qty 1

## 2018-01-17 MED ORDER — PROPOFOL 500 MG/50ML IV EMUL
INTRAVENOUS | Status: AC
  Filled 2018-01-17: qty 50

## 2018-01-17 MED ORDER — DEXAMETHASONE SODIUM PHOSPHATE 10 MG/ML IJ SOLN
INTRAMUSCULAR | Status: DC | PRN
  Administered 2018-01-17: 10 mg via INTRAVENOUS

## 2018-01-17 MED ORDER — CEFAZOLIN SODIUM-DEXTROSE 2-4 GM/100ML-% IV SOLN
INTRAVENOUS | Status: AC
  Filled 2018-01-17: qty 100

## 2018-01-17 MED ORDER — HYDROCODONE-ACETAMINOPHEN 5-325 MG PO TABS: 1.0000 | ORAL_TABLET | Freq: Four times a day (QID) | ORAL | 0 refills | Status: AC | PRN

## 2018-01-17 MED ORDER — MIDAZOLAM HCL 2 MG/2ML IJ SOLN
INTRAMUSCULAR | Status: AC
  Filled 2018-01-17: qty 2

## 2018-01-17 MED ORDER — CELECOXIB 200 MG PO CAPS
200.0000 mg | ORAL_CAPSULE | ORAL | Status: AC
  Administered 2018-01-17: 200 mg via ORAL

## 2018-01-17 MED ORDER — GABAPENTIN 300 MG PO CAPS
ORAL_CAPSULE | ORAL | Status: AC
  Filled 2018-01-17: qty 1

## 2018-01-17 MED ORDER — ACETAMINOPHEN 500 MG PO TABS
1000.0000 mg | ORAL_TABLET | ORAL | Status: AC
  Administered 2018-01-17: 1000 mg via ORAL

## 2018-01-17 MED ORDER — ONDANSETRON HCL 4 MG/2ML IJ SOLN
INTRAMUSCULAR | Status: DC | PRN
  Administered 2018-01-17: 4 mg via INTRAVENOUS

## 2018-01-17 MED ORDER — CEFAZOLIN SODIUM-DEXTROSE 2-4 GM/100ML-% IV SOLN
2.0000 g | INTRAVENOUS | Status: AC
  Administered 2018-01-17: 2 g via INTRAVENOUS

## 2018-01-17 MED ORDER — CEFAZOLIN SODIUM 1 G IJ SOLR
INTRAMUSCULAR | Status: AC
  Filled 2018-01-17: qty 20

## 2018-01-17 MED ORDER — FENTANYL CITRATE (PF) 100 MCG/2ML IJ SOLN
INTRAMUSCULAR | Status: DC | PRN
  Administered 2018-01-17: 50 ug via INTRAVENOUS
  Administered 2018-01-17 (×2): 25 ug via INTRAVENOUS

## 2018-01-17 SURGICAL SUPPLY — 67 items
ADH SKN CLS APL DERMABOND .7 (GAUZE/BANDAGES/DRESSINGS) ×1
APL SKNCLS STERI-STRIP NONHPOA (GAUZE/BANDAGES/DRESSINGS)
APPLIER CLIP 9.375 MED OPEN (MISCELLANEOUS)
APR CLP MED 9.3 20 MLT OPN (MISCELLANEOUS)
BENZOIN TINCTURE PRP APPL 2/3 (GAUZE/BANDAGES/DRESSINGS) IMPLANT
BINDER BREAST LRG (GAUZE/BANDAGES/DRESSINGS) ×2 IMPLANT
BINDER BREAST MEDIUM (GAUZE/BANDAGES/DRESSINGS) IMPLANT
BINDER BREAST XLRG (GAUZE/BANDAGES/DRESSINGS) IMPLANT
BINDER BREAST XXLRG (GAUZE/BANDAGES/DRESSINGS) IMPLANT
BLADE HEX COATED 2.75 (ELECTRODE) ×3 IMPLANT
BLADE SURG 10 STRL SS (BLADE) IMPLANT
BLADE SURG 15 STRL LF DISP TIS (BLADE) ×1 IMPLANT
BLADE SURG 15 STRL SS (BLADE) ×3
CANISTER SUC SOCK COL 7IN (MISCELLANEOUS) IMPLANT
CANISTER SUCT 1200ML W/VALVE (MISCELLANEOUS) ×3 IMPLANT
CHLORAPREP W/TINT 26ML (MISCELLANEOUS) ×3 IMPLANT
CLIP APPLIE 9.375 MED OPEN (MISCELLANEOUS) IMPLANT
CLOSURE WOUND 1/2 X4 (GAUZE/BANDAGES/DRESSINGS)
COVER BACK TABLE 60X90IN (DRAPES) ×3 IMPLANT
COVER MAYO STAND STRL (DRAPES) ×3 IMPLANT
COVER PROBE W GEL 5X96 (DRAPES) ×3 IMPLANT
DECANTER SPIKE VIAL GLASS SM (MISCELLANEOUS) IMPLANT
DERMABOND ADVANCED (GAUZE/BANDAGES/DRESSINGS) ×2
DERMABOND ADVANCED .7 DNX12 (GAUZE/BANDAGES/DRESSINGS) ×1 IMPLANT
DEVICE DUBIN W/COMP PLATE 8390 (MISCELLANEOUS) ×3 IMPLANT
DRAPE LAPAROSCOPIC ABDOMINAL (DRAPES) ×3 IMPLANT
DRAPE UTILITY XL STRL (DRAPES) ×3 IMPLANT
DRSG PAD ABDOMINAL 8X10 ST (GAUZE/BANDAGES/DRESSINGS) IMPLANT
ELECT REM PT RETURN 9FT ADLT (ELECTROSURGICAL) ×3
ELECTRODE REM PT RTRN 9FT ADLT (ELECTROSURGICAL) ×1 IMPLANT
GAUZE SPONGE 4X4 12PLY STRL LF (GAUZE/BANDAGES/DRESSINGS) IMPLANT
GLOVE BIO SURGEON STRL SZ 6.5 (GLOVE) ×1 IMPLANT
GLOVE BIO SURGEONS STRL SZ 6.5 (GLOVE) ×1
GLOVE BIOGEL PI IND STRL 8 (GLOVE) IMPLANT
GLOVE BIOGEL PI INDICATOR 8 (GLOVE) ×2
GLOVE EUDERMIC 7 POWDERFREE (GLOVE) ×3 IMPLANT
GOWN STRL REUS W/ TWL LRG LVL3 (GOWN DISPOSABLE) ×1 IMPLANT
GOWN STRL REUS W/ TWL XL LVL3 (GOWN DISPOSABLE) ×1 IMPLANT
GOWN STRL REUS W/TWL LRG LVL3 (GOWN DISPOSABLE) ×3
GOWN STRL REUS W/TWL XL LVL3 (GOWN DISPOSABLE) ×3
ILLUMINATOR WAVEGUIDE N/F (MISCELLANEOUS) IMPLANT
KIT MARKER MARGIN INK (KITS) ×3 IMPLANT
LIGHT WAVEGUIDE WIDE FLAT (MISCELLANEOUS) IMPLANT
NDL HYPO 25X1 1.5 SAFETY (NEEDLE) ×1 IMPLANT
NEEDLE HYPO 25X1 1.5 SAFETY (NEEDLE) ×3 IMPLANT
NS IRRIG 1000ML POUR BTL (IV SOLUTION) ×3 IMPLANT
PACK BASIN DAY SURGERY FS (CUSTOM PROCEDURE TRAY) ×3 IMPLANT
PENCIL BUTTON HOLSTER BLD 10FT (ELECTRODE) ×3 IMPLANT
SHEET MEDIUM DRAPE 40X70 STRL (DRAPES) IMPLANT
SLEEVE SCD COMPRESS KNEE MED (MISCELLANEOUS) ×3 IMPLANT
SPONGE LAP 18X18 RF (DISPOSABLE) IMPLANT
SPONGE LAP 4X18 RFD (DISPOSABLE) ×3 IMPLANT
STRIP CLOSURE SKIN 1/2X4 (GAUZE/BANDAGES/DRESSINGS) IMPLANT
SUT ETHILON 3 0 FSL (SUTURE) IMPLANT
SUT MNCRL AB 4-0 PS2 18 (SUTURE) ×3 IMPLANT
SUT SILK 2 0 SH (SUTURE) ×3 IMPLANT
SUT VIC AB 2-0 CT1 27 (SUTURE)
SUT VIC AB 2-0 CT1 TAPERPNT 27 (SUTURE) IMPLANT
SUT VIC AB 3-0 SH 27 (SUTURE)
SUT VIC AB 3-0 SH 27X BRD (SUTURE) IMPLANT
SUT VICRYL 3-0 CR8 SH (SUTURE) ×3 IMPLANT
SYR 10ML LL (SYRINGE) ×3 IMPLANT
TOWEL GREEN STERILE FF (TOWEL DISPOSABLE) ×3 IMPLANT
TOWEL OR NON WOVEN STRL DISP B (DISPOSABLE) IMPLANT
TUBE CONNECTING 20'X1/4 (TUBING) ×1
TUBE CONNECTING 20X1/4 (TUBING) ×2 IMPLANT
YANKAUER SUCT BULB TIP NO VENT (SUCTIONS) ×3 IMPLANT

## 2018-01-17 NOTE — Op Note (Addendum)
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  Surgeon:                     Edsel Petrin. Dalbert Batman, M.D., FACS  Assistant:                     OR staff  Operative Indications:   This is a very pleasant 32 year old female who presents with a palpable mass of the left breast at 4 o'clock position she is referred by Dr. cousins, her gynecologist. Imaging studies and pathology was performed at [facility]. She is from [place]      She felt a lump in her left breast since December. She says this fluctuates in size a little bit but not very painful. 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 The radiologis at [facility] that recommended this was Judd Gaudier, MD.    Past history is mostly unremarkable. 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 Family history is negative for breast or ovarian cancer     She clearly wants this palpable mass removed. That is reasonable. She'll be scheduled for left breast lumpectomy with radioactive seed localization.  Operative Findings:       The tumor was palpable and contained the radioactive seed.  It was about 2 or 3 cm outside the left areolar margin at the 4 o'clock position.  I was able to make a circumareolar, hidden scar technique incision.  The tumor was removed it looked smoothly marginated and firm like a benign fibroadenoma.  Specimen radiograph showed the original titanium biopsy clip and the radioactive seed.  Procedure in Detail:          Following the induction of general LMA anesthesia the patient's  left breast was prepped and draped in a sterile fashion.  Surgical timeout was performed.  Intravenous antibiotics were given.  0.25% Marcaine with epinephrine was used as a local infiltration anesthetic.      Using the neoprobe I identified the radioactive signal at 4 o'clock position left breast.  I made an incision laterally at the areolar margin.  Lumpectomy was performed using the neoprobe and electrocautery.  The specimen was removed and marked with silk sutures and a 6 color ink kit to orient the pathologist.  Specimen mammogram looked good as described above.  The specimen was sent to the pathology lab where the seed was retrieved.  Hemostasis was excellent.  The wound was irrigated.  5 metal marker clips were placed in the walls of the lumpectomy cavity.  The lumpectomy was closed in layers with interrupted 3-0 Vicryl and the skin closed with a running subcuticular 4-0 Monocryl and Dermabond.  Clean bandages and a breast binder were placed.  The patient tolerated the procedure well and was taken to PACU in stable condition.  EBL 10 cc.  Counts correct.  Complications none.    Addendum: I logged onto the [employer] and reviewed her prescription medication history     Leovardo Thoman M. Dalbert Batman, M.D., FACS General and Minimally Invasive Surgery Breast and Colorectal Surgery  01/17/2018 9:43 AM

## 2018-01-17 NOTE — Interval H&P Note (Signed)
History and Physical Interval Note:  01/17/2018 9:42 AM  Lynn Cox  has presented today for surgery, with the diagnosis of left breast mass lower outer quadrant  The various methods of treatment have been discussed with the patient and family. After consideration of risks, benefits and other options for treatment, the patient has consented to  Procedure(s): LEFT BREAST LUMPECTOMY WITH RADIOACTIVE SEED LOCALIZATION ERAS PATHWAY (Left) as a surgical intervention .  The patient's history has been reviewed, patient examined, no change in status, stable for surgery.  I have reviewed the patient's chart and labs.  Questions were answered to the patient's satisfaction.     Adin Hector

## 2018-01-17 NOTE — Anesthesia Postprocedure Evaluation (Signed)
Anesthesia Post Note  Patient: Lynn Cox  Procedure(s) Performed: LEFT BREAST LUMPECTOMY WITH RADIOACTIVE SEED LOCALIZATION ERAS PATHWAY (Left Breast)     Patient location during evaluation: PACU Anesthesia Type: General Level of consciousness: awake and alert Pain management: pain level controlled Vital Signs Assessment: post-procedure vital signs reviewed and stable Respiratory status: spontaneous breathing, nonlabored ventilation, respiratory function stable and patient connected to nasal cannula oxygen Cardiovascular status: blood pressure returned to baseline and stable Postop Assessment: no apparent nausea or vomiting Anesthetic complications: no    Last Vitals:  Vitals:   01/17/18 1015 01/17/18 1048  BP: 116/81 130/78  Pulse: 87 66  Resp: 15 18  Temp:  36.5 C  SpO2: 100% 100%    Last Pain:  Vitals:   01/17/18 1048  TempSrc:   PainSc: 0-No pain                 Montez Hageman

## 2018-01-17 NOTE — Anesthesia Preprocedure Evaluation (Addendum)
Anesthesia Evaluation  Patient identified by MRN, date of birth, ID band Patient awake    Reviewed: Allergy & Precautions, NPO status , Patient's Chart, lab work & pertinent test results  Airway Mallampati: II  TM Distance: >3 FB Neck ROM: Full    Dental no notable dental hx.    Pulmonary neg pulmonary ROS,    Pulmonary exam normal breath sounds clear to auscultation       Cardiovascular negative cardio ROS Normal cardiovascular exam Rhythm:Regular Rate:Normal     Neuro/Psych negative neurological ROS  negative psych ROS   GI/Hepatic negative GI ROS, Neg liver ROS,   Endo/Other  negative endocrine ROS  Renal/GU negative Renal ROS  negative genitourinary   Musculoskeletal negative musculoskeletal ROS (+)   Abdominal   Peds negative pediatric ROS (+)  Hematology Protein S deficiency with pregnancy. No history of blood clots otherwise   Anesthesia Other Findings   Reproductive/Obstetrics negative OB ROS                            Anesthesia Physical Anesthesia Plan  ASA: II  Anesthesia Plan: General   Post-op Pain Management:    Induction: Intravenous  PONV Risk Score and Plan: 3 and Ondansetron, Dexamethasone, Midazolam, Scopolamine patch - Pre-op and Treatment may vary due to age or medical condition  Airway Management Planned: LMA  Additional Equipment:   Intra-op Plan:   Post-operative Plan: Extubation in OR  Informed Consent: I have reviewed the patients History and Physical, chart, labs and discussed the procedure including the risks, benefits and alternatives for the proposed anesthesia with the patient or authorized representative who has indicated his/her understanding and acceptance.   Dental advisory given  Plan Discussed with: CRNA  Anesthesia Plan Comments:         Anesthesia Quick Evaluation

## 2018-01-17 NOTE — Discharge Instructions (Signed)
[place] Office Phone Number [phone]  BREAST BIOPSY/ PARTIAL MASTECTOMY: POST OP INSTRUCTIONS  Always review your discharge instruction sheet given to you by the facility where your surgery was performed.  IF YOU HAVE DISABILITY OR FAMILY LEAVE FORMS, YOU MUST BRING THEM TO THE OFFICE FOR PROCESSING.  DO NOT GIVE THEM TO YOUR DOCTOR.  1. A prescription for pain medication may be given to you upon discharge.  Take your pain medication as prescribed, if needed.  If narcotic pain medicine is not needed, then you may take acetaminophen (Tylenol) or ibuprofen (Advil) as needed. 2. Take your usually prescribed medications unless otherwise directed 3. If you need a refill on your pain medication, please contact your pharmacy.  They will contact our office to request authorization.  Prescriptions will not be filled after 5pm or on week-ends. 4. You should eat very light the first 24 hours after surgery, such as soup, crackers, pudding, etc.  Resume your normal diet the day after surgery. 5. Most patients will experience some swelling and bruising in the breast.  Ice packs and a good support bra will help.  Swelling and bruising can take several days to resolve.  6. It is common to experience some constipation if taking pain medication after surgery.  Increasing fluid intake and taking a stool softener will usually help or prevent this problem from occurring.  A mild laxative (Milk of Magnesia or Miralax) should be taken according to package directions if there are no bowel movements after 48 hours. 7. Unless discharge instructions indicate otherwise, you may remove your bandages 24-48 hours after surgery, and you may shower at that time.  You may have steri-strips (small skin tapes) in place directly over the incision.  These strips should be left on the skin for 7-10 days.  If your surgeon used skin glue on the incision, you may shower in 24 hours.  The glue will flake off over the  next 2-3 weeks.  Any sutures or staples will be removed at the office during your follow-up visit. 8. ACTIVITIES:  You may resume regular daily activities (gradually increasing) beginning the next day.  Wearing a good support bra or sports bra minimizes pain and swelling.  You may have sexual intercourse when it is comfortable. a. You may drive when you no longer are taking prescription pain medication, you can comfortably wear a seatbelt, and you can safely maneuver your car and apply brakes. b. RETURN TO WORK:  ______________________________________________________________________________________ 9. You should see your doctor in the office for a follow-up appointment approximately two weeks after your surgery.  Your doctors nurse will typically make your follow-up appointment when she calls you with your pathology report.  Expect your pathology report 2-3 business days after your surgery.  You may call to check if you do not hear from [place] after three days. 10. OTHER INSTRUCTIONS: _______________________________________________________________________________________________ _____________________________________________________________________________________________________________________________________ _____________________________________________________________________________________________________________________________________ _____________________________________________________________________________________________________________________________________  WHEN TO CALL YOUR DOCTOR: 1. Fever over 101.0 2. Nausea and/or vomiting. 3. Extreme swelling or bruising. 4. Continued bleeding from incision. 5. Increased pain, redness, or drainage from the incision.  The clinic staff is available to answer your questions during regular business hours.  Please dont hesitate to call and ask to speak to one of the nurses for clinical concerns.  If you have a medical emergency, go to the nearest  emergency room or call 911.  A surgeon from [facility] is always on call at the hospital.  For further questions, please visit [email or web address]  Post Anesthesia Home Care Instructions  Activity: Get plenty of rest for the remainder of the day. A responsible individual must stay with you for 24 hours following the procedure.  For the next 24 hours, DO NOT: -Drive a car -Paediatric nurse -Drink alcoholic beverages -Take any medication unless instructed by your physician -Make any legal decisions or sign important papers.  Meals: Start with liquid foods such as gelatin or soup. Progress to regular foods as tolerated. Avoid greasy, spicy, heavy foods. If nausea and/or vomiting occur, drink only clear liquids until the nausea and/or vomiting subsides. Call your physician if vomiting continues.  Special Instructions/Symptoms: Your throat may feel dry or sore from the anesthesia or the breathing tube placed in your throat during surgery. If this causes discomfort, gargle with warm salt water. The discomfort should disappear within 24 hours.  If you had a scopolamine patch placed behind your ear for the management of post- operative nausea and/or vomiting:  1. The medication in the patch is effective for 72 hours, after which it should be removed.  Wrap patch in a tissue and discard in the trash. Wash hands thoroughly with soap and water. 2. You may remove the patch earlier than 72 hours if you experience unpleasant side effects which may include dry mouth, dizziness or visual disturbances. 3. Avoid touching the patch. Wash your hands with soap and water after contact with the patch.   No ibuprofen products until after 4pm 01/17/18

## 2018-01-17 NOTE — Anesthesia Procedure Notes (Signed)
Procedure Name: LMA Insertion Date/Time: 01/17/2018 9:04 AM Performed by: Genelle Bal, CRNA Pre-anesthesia Checklist: Patient identified, Emergency Drugs available, Suction available and Patient being monitored Patient Re-evaluated:Patient Re-evaluated prior to induction Oxygen Delivery Method: Circle system utilized Preoxygenation: Pre-oxygenation with 100% oxygen Induction Type: IV induction Ventilation: Mask ventilation without difficulty LMA: LMA inserted LMA Size: 4.0 Number of attempts: 1 Placement Confirmation: positive ETCO2 Tube secured with: Tape Dental Injury: Teeth and Oropharynx as per pre-operative assessment

## 2018-01-17 NOTE — Transfer of Care (Signed)
Immediate Anesthesia Transfer of Care Note  Patient: Lynn Cox  Procedure(s) Performed: LEFT BREAST LUMPECTOMY WITH RADIOACTIVE SEED LOCALIZATION ERAS PATHWAY (Left Breast)  Patient Location: PACU  Anesthesia Type:General  Level of Consciousness: drowsy and patient cooperative  Airway & Oxygen Therapy: Patient Spontanous Breathing and Patient connected to face mask oxygen  Post-op Assessment: Report given to RN and Post -op Vital signs reviewed and stable  Post vital signs: Reviewed and stable  Last Vitals:  Vitals Value Taken Time  BP    Temp    Pulse 75 01/17/2018  9:49 AM  Resp    SpO2 100 % 01/17/2018  9:49 AM  Vitals shown include unvalidated device data.  Last Pain:  Vitals:   01/17/18 0730  TempSrc: Oral  PainSc: 0-No pain         Complications: No apparent anesthesia complications

## 2018-01-20 ENCOUNTER — Encounter (HOSPITAL_BASED_OUTPATIENT_CLINIC_OR_DEPARTMENT_OTHER): Payer: Self-pay | Admitting: General Surgery

## 2018-02-13 ENCOUNTER — Other Ambulatory Visit: Payer: Self-pay | Admitting: General Surgery

## 2019-10-03 IMAGING — US US PLC BREAST LOC DEV 1ST LESION  INC US GUIDE*L*
1 series · 4 of 4 positions shown · non-contrast
Comparison: Previous exam(s).

CLINICAL DATA: 32-year-old female for preoperative radioactive seed
localization of a mass in the left breast at the 4 o'clock position.

EXAM:
ULTRASOUND GUIDED RADIOACTIVE SEED LOCALIZATION OF THE LEFT BREAST

[Series 1: us plc breast loc dev 1st lesion inc us guide*left · 0.07mm/px · 4 of 4 slices shown]
[im 1/4]
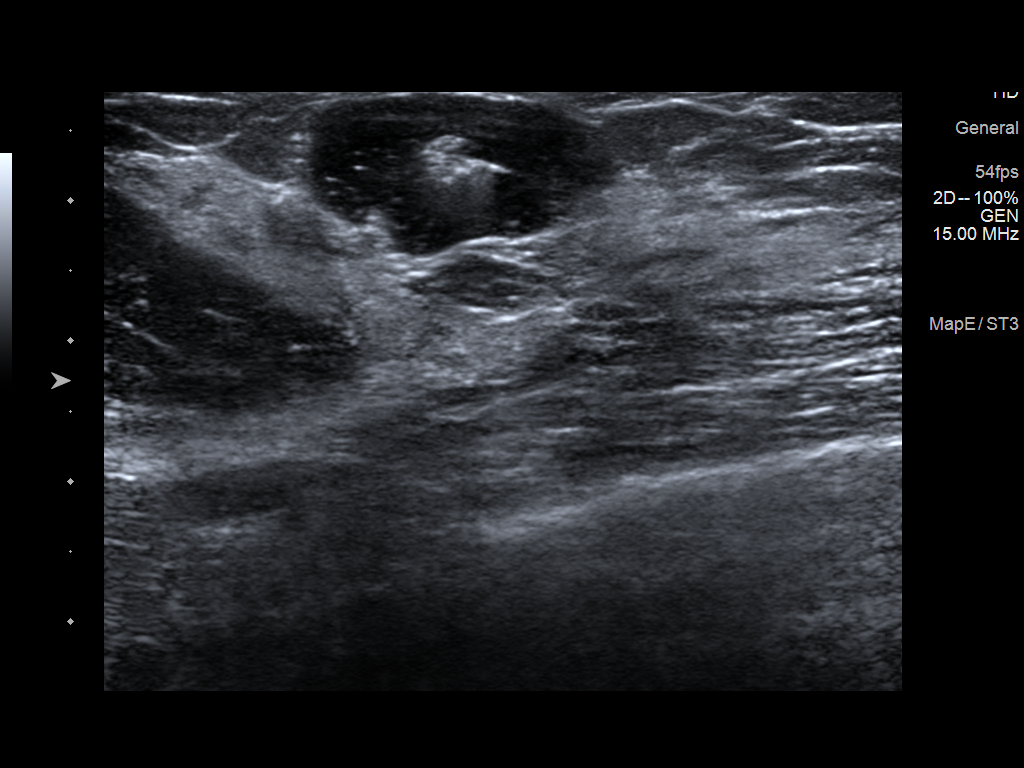
[im 2/4]
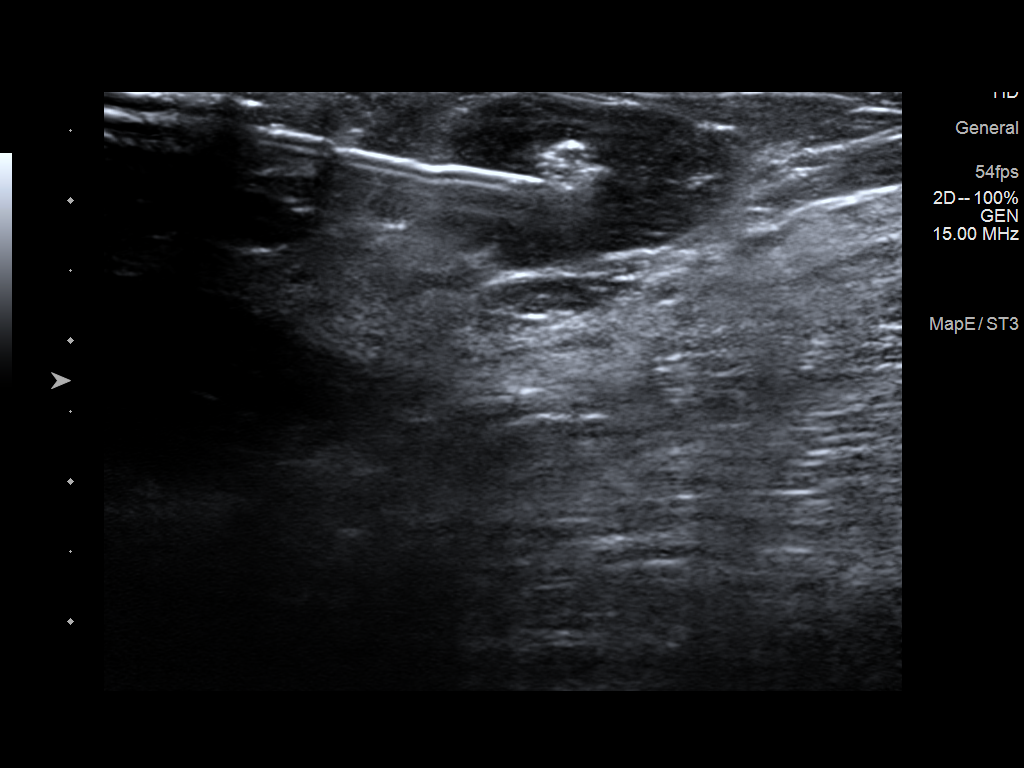
[im 3/4]
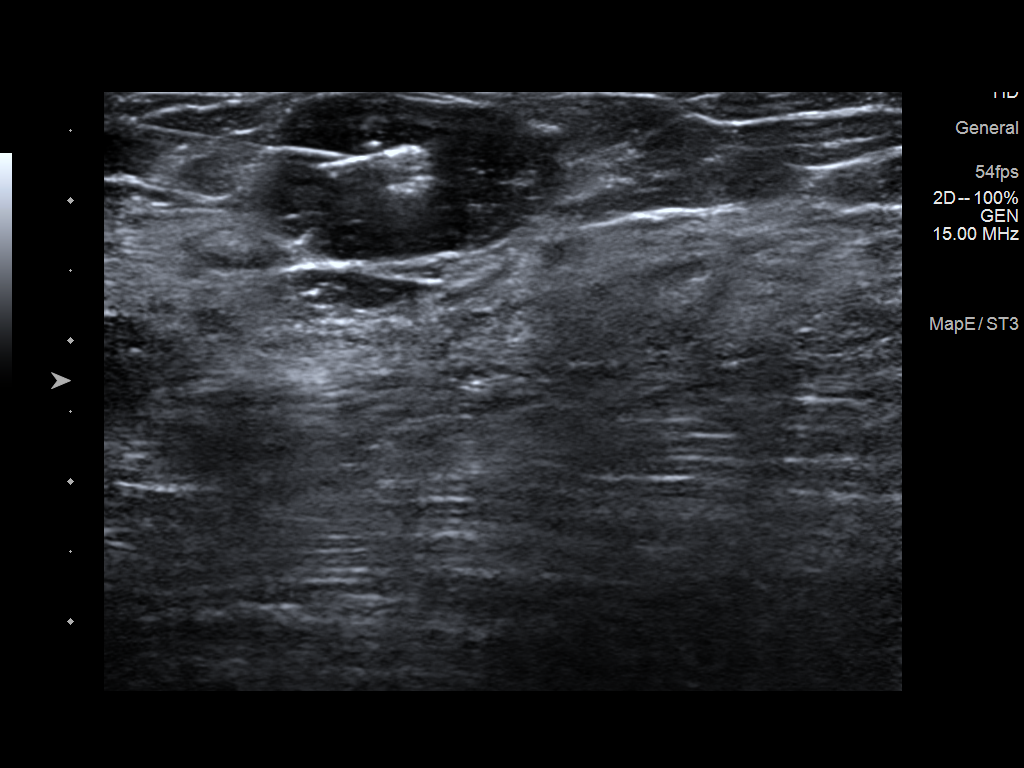
[im 4/4]
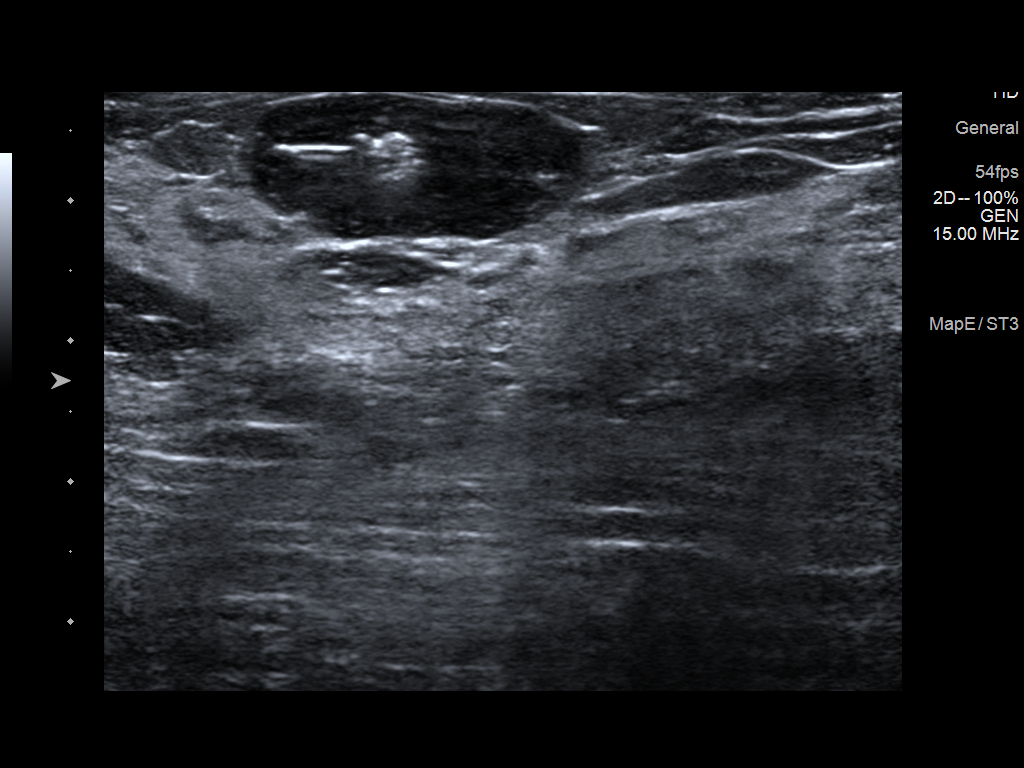

[4 of 4 positions shown; findings below may reference images not displayed]

FINDINGS: Patient presents for radioactive seed localization prior to left
breast excision. I met with the patient and we discussed the
procedure of seed localization including benefits and alternatives.
We discussed the high likelihood of a successful procedure. We
discussed the risks of the procedure including infection, bleeding,
tissue injury and further surgery. We discussed the low dose of
radioactivity involved in the procedure. Informed, written consent
was given.

The usual time-out protocol was performed immediately prior to the
procedure.

Using ultrasound guidance, sterile technique, 1% lidocaine and an
[id number] radioactive seed, the mass containing the biopsy marking clip
in the left breast at the 4 o'clock position was localized using a
lateral to medial approach. The follow-up mammogram images confirm
the seed in the expected location and were marked for Dr. Jan Sorif.

Follow-up survey of the patient confirms presence of the radioactive
seed.

Order number of H-LM6 seed:  [id number].

Total activity:  0.248 millicuries reference Date: 12/31/2017

The patient tolerated the procedure well and was released from the
[REDACTED]. She was given instructions regarding seed removal.
IMPRESSION: Radioactive seed localization left breast. No apparent
complications.

## 2019-10-04 IMAGING — MG BREAST SURGICAL SPECIMEN
1 series · 1 of 1 positions shown · non-contrast
Comparison: Previous exam(s).

CLINICAL DATA: Evaluate surgical specimen following radioactive
seed localization of LEFT breast mass and breast excision.

EXAM:
SPECIMEN RADIOGRAPH OF THE LEFT BREAST

[L]
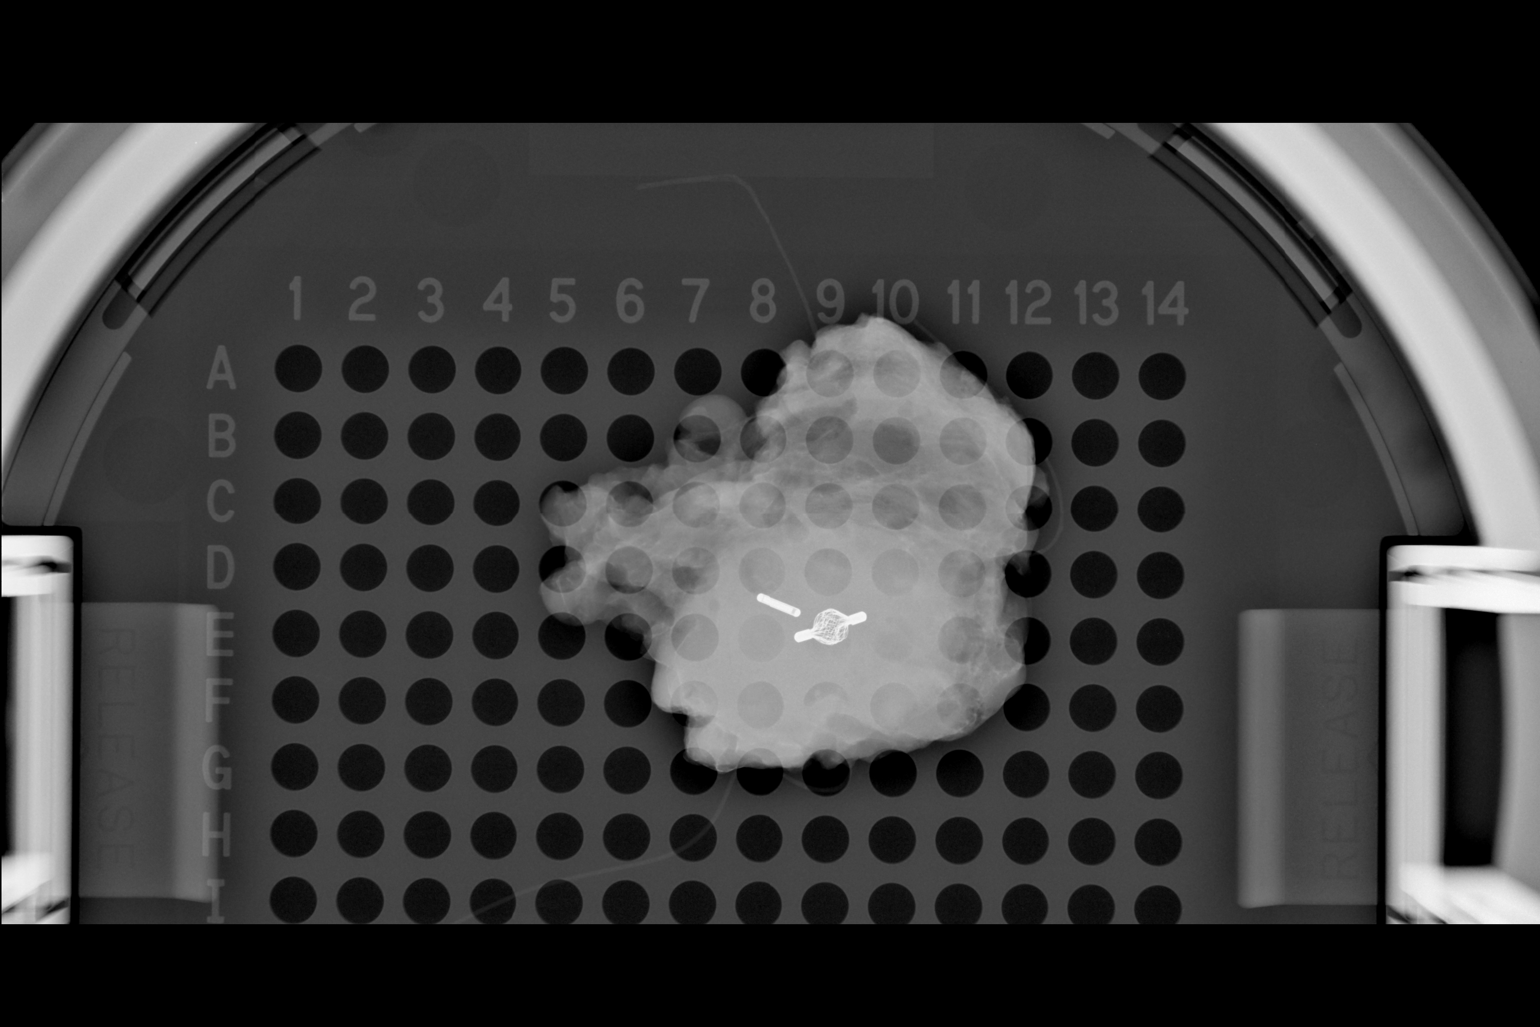

[1 of 1 positions shown; findings below may reference images not displayed]

FINDINGS: Status post excision of the LEFT breast. The radioactive seed and
biopsy marker clip are present, completely intact, and were marked
for pathology.
IMPRESSION: Specimen radiograph of the LEFT breast.
